# Patient Record
Sex: Female | Born: 1985 | Race: White | Hispanic: No | Marital: Married | Smoking: Current every day smoker
Health system: Southern US, Community
[De-identification: ages and names within clinical notes are randomized; demographics above are authoritative.]

## PROBLEM LIST (undated history)

## (undated) ENCOUNTER — Emergency Department (HOSPITAL_COMMUNITY): Discharge status: Absent without leave | Payer: No Typology Code available for payment source

## (undated) DIAGNOSIS — J45909 Unspecified asthma, uncomplicated: Secondary | ICD-10-CM

## (undated) DIAGNOSIS — M1990 Unspecified osteoarthritis, unspecified site: Secondary | ICD-10-CM

## (undated) DIAGNOSIS — M549 Dorsalgia, unspecified: Secondary | ICD-10-CM

## (undated) DIAGNOSIS — R011 Cardiac murmur, unspecified: Secondary | ICD-10-CM

## (undated) DIAGNOSIS — F32A Depression, unspecified: Secondary | ICD-10-CM

## (undated) DIAGNOSIS — F419 Anxiety disorder, unspecified: Secondary | ICD-10-CM

## (undated) DIAGNOSIS — K219 Gastro-esophageal reflux disease without esophagitis: Secondary | ICD-10-CM

## (undated) DIAGNOSIS — G8929 Other chronic pain: Secondary | ICD-10-CM

## (undated) HISTORY — PX: TUBAL LIGATION: SHX77

---
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
## 2007-07-25 ENCOUNTER — Emergency Department (HOSPITAL_COMMUNITY): Admission: EM | Admit: 2007-07-25 | Discharge: 2007-07-25 | Payer: Self-pay | Admitting: Emergency Medicine

## 2007-08-02 ENCOUNTER — Emergency Department (HOSPITAL_COMMUNITY): Admission: EM | Admit: 2007-08-02 | Discharge: 2007-08-02 | Payer: Self-pay | Admitting: Emergency Medicine

## 2008-01-14 ENCOUNTER — Emergency Department (HOSPITAL_COMMUNITY): Admission: EM | Admit: 2008-01-14 | Discharge: 2008-01-14 | Payer: Self-pay | Admitting: Emergency Medicine

## 2009-04-05 ENCOUNTER — Emergency Department (HOSPITAL_COMMUNITY): Admission: EM | Admit: 2009-04-05 | Discharge: 2009-04-05 | Payer: Self-pay | Admitting: Emergency Medicine

## 2009-05-28 ENCOUNTER — Emergency Department (HOSPITAL_COMMUNITY): Admission: EM | Admit: 2009-05-28 | Discharge: 2009-05-28 | Payer: Self-pay | Admitting: Emergency Medicine

## 2011-06-17 LAB — URINALYSIS, ROUTINE W REFLEX MICROSCOPIC
Glucose, UA: NEGATIVE
Hgb urine dipstick: NEGATIVE
Ketones, ur: NEGATIVE
Protein, ur: NEGATIVE
pH: 6.5

## 2011-06-17 LAB — WET PREP, GENITAL
Trich, Wet Prep: NONE SEEN
Yeast Wet Prep HPF POC: NONE SEEN

## 2011-06-17 LAB — GC/CHLAMYDIA PROBE AMP, GENITAL: GC Probe Amp, Genital: NEGATIVE

## 2011-07-01 LAB — URINE MICROSCOPIC-ADD ON

## 2011-07-01 LAB — PREGNANCY, URINE: Preg Test, Ur: NEGATIVE

## 2011-07-01 LAB — STREP A DNA PROBE

## 2011-07-01 LAB — URINALYSIS, ROUTINE W REFLEX MICROSCOPIC
Nitrite: POSITIVE — AB
Specific Gravity, Urine: <1.005 — ABNORMAL LOW
Urobilinogen, UA: 0.2
pH: 6

## 2011-07-01 LAB — URINE CULTURE

## 2013-09-21 ENCOUNTER — Emergency Department (HOSPITAL_COMMUNITY)
Admission: EM | Admit: 2013-09-21 | Discharge: 2013-09-21 | Disposition: A | Discharge status: Home / Self Care | Payer: Medicaid - Out of State | Attending: Emergency Medicine | Admitting: Emergency Medicine

## 2013-09-21 ENCOUNTER — Encounter (HOSPITAL_COMMUNITY): Payer: Self-pay | Admitting: Emergency Medicine

## 2013-09-21 DIAGNOSIS — F17200 Nicotine dependence, unspecified, uncomplicated: Secondary | ICD-10-CM | POA: Insufficient documentation

## 2013-09-21 DIAGNOSIS — J069 Acute upper respiratory infection, unspecified: Secondary | ICD-10-CM

## 2013-09-21 DIAGNOSIS — IMO0001 Reserved for inherently not codable concepts without codable children: Secondary | ICD-10-CM | POA: Insufficient documentation

## 2013-09-21 NOTE — ED Notes (Signed)
Cough, green sputum.  Chills, no fever.  abd pain

## 2013-09-21 NOTE — ED Provider Notes (Signed)
CSN: [id number]     Arrival date & time 09/21/13  1916 History   First MD Initiated Contact with Patient 09/21/13 1959     Chief Complaint  Patient presents with  . Cough   (Consider location/radiation/quality/duration/timing/severity/associated sxs/prior Treatment) HPI Comments: 27 yo female with vaccines UTD, smoker, no medical hx presents with cough and chills for three days.  Sick contacts with similar.  Mild body aches.  No travel or neck stiffness. Productive. No current abx.  Patient is a 27 y.o. female presenting with cough. The history is provided by the patient.  Cough Associated symptoms: no chills, no fever, no rash and no shortness of breath     History reviewed. No pertinent past medical history. History reviewed. No pertinent past surgical history. History reviewed. No pertinent family history. History  Substance Use Topics  . Smoking status: Current Every Day Smoker  . Smokeless tobacco: Not on file  . Alcohol Use: Yes   OB History   Grav Para Term Preterm Abortions TAB SAB Ect Mult Living                 Review of Systems  Constitutional: Negative for fever and chills.  HENT: Positive for congestion.   Eyes: Negative for visual disturbance.  Respiratory: Positive for cough. Negative for shortness of breath.   Gastrointestinal: Negative for vomiting.  Genitourinary: Negative for dysuria and flank pain.  Musculoskeletal: Negative for back pain, neck pain and neck stiffness.  Skin: Negative for rash.    Allergies  Review of patient's allergies indicates no known allergies.  Home Medications  No current outpatient prescriptions on file. BP 117/71  Pulse 96  Temp(Src) 98 F (36.7 C) (Oral)  Resp 20  Ht 5\' 4"  (1.626 m)  Wt 171 lb (77.565 kg)  BMI 29.34 kg/m2  SpO2 99%  LMP 09/01/2013 Physical Exam  Nursing note and vitals reviewed. Constitutional: She is oriented to person, place, and time. She appears well-developed and well-nourished.  HENT:   Head: Normocephalic and atraumatic.  Eyes: Conjunctivae are normal. Right eye exhibits no discharge. Left eye exhibits no discharge.  Neck: Normal range of motion. Neck supple. No tracheal deviation present.  Cardiovascular: Normal rate and regular rhythm.   Pulmonary/Chest: Effort normal and breath sounds normal.  Abdominal: Soft. She exhibits no distension. There is no tenderness. There is no guarding.  Musculoskeletal: She exhibits no edema.  Neurological: She is alert and oriented to person, place, and time.  Skin: Skin is warm. No rash noted.  Psychiatric: She has a normal mood and affect.    ED Course  Procedures (including critical care time) Labs Review Labs Reviewed - No data to display Imaging Review No results found.  EKG Interpretation   None       MDM   1. URI, acute    Well appearing. No meningismus. Results and differential diagnosis were discussed with the patient. Close follow up outpatient was discussed, patient comfortable with the plan.   Diagnosis: URI    Enid Skeens, MD 09/21/13 2030

## 2013-09-21 NOTE — ED Notes (Signed)
MD at bedside. 

## 2016-09-10 ENCOUNTER — Emergency Department (HOSPITAL_COMMUNITY)
Admission: EM | Admit: 2016-09-10 | Discharge: 2016-09-10 | Disposition: A | Discharge status: Home / Self Care | Payer: Medicaid - Out of State | Attending: Emergency Medicine | Admitting: Emergency Medicine

## 2016-09-10 ENCOUNTER — Encounter (HOSPITAL_COMMUNITY): Payer: Self-pay | Admitting: Emergency Medicine

## 2016-09-10 DIAGNOSIS — F17210 Nicotine dependence, cigarettes, uncomplicated: Secondary | ICD-10-CM | POA: Insufficient documentation

## 2016-09-10 DIAGNOSIS — J069 Acute upper respiratory infection, unspecified: Secondary | ICD-10-CM

## 2016-09-10 DIAGNOSIS — R110 Nausea: Secondary | ICD-10-CM | POA: Insufficient documentation

## 2016-09-10 DIAGNOSIS — Z79899 Other long term (current) drug therapy: Secondary | ICD-10-CM | POA: Insufficient documentation

## 2016-09-10 MED ORDER — PREDNISONE 20 MG PO TABS: 40.0000 mg | ORAL_TABLET | Freq: Every day | ORAL | 0 refills | Status: DC

## 2016-09-10 MED ORDER — PREDNISONE 20 MG PO TABS
40.0000 mg | ORAL_TABLET | Freq: Once | ORAL | Status: AC
  Administered 2016-09-10: 40 mg via ORAL
  Filled 2016-09-10: qty 2

## 2016-09-10 MED ORDER — GUAIFENESIN-CODEINE 100-10 MG/5ML PO SYRP: 10.0000 mL | ORAL_SOLUTION | Freq: Three times a day (TID) | ORAL | 0 refills | Status: DC | PRN

## 2016-09-10 MED ORDER — BENZONATATE 100 MG PO CAPS
200.0000 mg | ORAL_CAPSULE | Freq: Once | ORAL | Status: AC
  Administered 2016-09-10: 200 mg via ORAL
  Filled 2016-09-10: qty 2

## 2016-09-10 NOTE — ED Triage Notes (Signed)
Pt c/o nausea, body aches, cough, sore throat and otalgia that started 4 days ago.

## 2016-09-10 NOTE — Discharge Instructions (Signed)
Drink plenty of fluids.  Tylenol every 4 hrs as needed for fever or body aches.  Stop the OTC cold medications.  Follow-up with your doctor or return here if needed.

## 2016-09-14 NOTE — ED Provider Notes (Signed)
[facility]EMERG[facility]Y DEPT Provider Note   CSN: [id number] Arrival date & time: 09/10/16  2046     History   Chief Complaint Chief Complaint  Patient presents with  . Otalgia  . Sore Throat    HPI Tad MooreJessica D Castaneda is a 30 y.o. female.  HPI  Tad MooreJessica D Castaneda is a 30 y.o. female who presents to the Emergency Department complaining of cough, sore throat, nasal congestion  And body aches.  Symptoms began 4 days ago.   She has not taken any medications for symptom relief.  Cough has been nonproductive.  Also reports some nausea.  She denies fever, chills, vomiting or abd pain.  Also denies chest pain or shortness of breath.    History reviewed. No pertinent past medical history.  There are no active problems to display for this patient.   Past Surgical History:  Procedure Laterality Date  . TUBAL LIGATION      OB History    Gravida Para Term Preterm AB Living   3 3 3     3    SAB TAB Ectopic Multiple Live Births                   Home Medications    Prior to Admission medications   Medication Sig Start Date End Date Taking? Authorizing Provider  guaiFENesin-codeine (ROBITUSSIN AC) 100-10 MG/5ML syrup Take 10 mLs by mouth 3 (three) times daily as needed. 09/10/16   Fardeen Steinberger, PA-C  predniSONE (DELTASONE) 20 MG tablet Take 2 tablets (40 mg total) by mouth daily. For 5 days 09/10/16   Pauline Ausammy Jeany [place], PA-C  Prenatal Vit-Fe Fumarate-FA (MULTIVITAMIN-PRENATAL) 27-0.8 MG TABS tablet Take 1 tablet by mouth daily at 12 noon.    Historical Provider, MD    Family History History reviewed. No pertinent family history.  Social History Social History  Substance Use Topics  . Smoking status: Current Every Day Smoker    Packs/day: 0.50    Types: Cigarettes  . Smokeless tobacco: Never Used  . Alcohol use Yes     Comment: socially     Allergies   Patient has no known allergies.   Review of Systems Review of Systems  Constitutional: Negative for activity change,  appetite change, chills and fever.  HENT: Positive for congestion, ear pain, rhinorrhea and sore throat. Negative for facial swelling and trouble swallowing.   Eyes: Negative for visual disturbance.  Respiratory: Positive for cough. Negative for shortness of breath, wheezing and stridor.   Cardiovascular: Negative for chest pain.  Gastrointestinal: Positive for nausea. Negative for abdominal pain and vomiting.  Genitourinary: Negative for dysuria.  Musculoskeletal: Negative for neck pain and neck stiffness.  Skin: Negative.   Neurological: Negative for dizziness, weakness, numbness and headaches.  Hematological: Negative for adenopathy.  Psychiatric/Behavioral: Negative for confusion.  All other systems reviewed and are negative.    Physical Exam Updated Vital Signs BP 96/61 (BP Location: Right Arm)   Pulse 72   Temp 97.7 F (36.5 C) (Oral)   Resp 18   Ht 5\' 4"  (1.626 m)   Wt 90.7 kg   LMP 08/16/2016   SpO2 97%   BMI 34.33 kg/m   Physical Exam  Constitutional: She is oriented to person, place, and time. She appears well-developed and well-nourished. No distress.  HENT:  Head: Normocephalic and atraumatic.  Right Ear: Tympanic membrane and ear canal normal.  Left Ear: Tympanic membrane and ear canal normal.  Nose: Mucosal edema and rhinorrhea present.  Mouth/Throat: Uvula is  midline and mucous membranes are normal. No trismus in the jaw. No uvula swelling. Posterior oropharyngeal erythema present. No oropharyngeal exudate, posterior oropharyngeal edema or tonsillar abscesses.  Eyes: Conjunctivae are normal.  Neck: Normal range of motion and phonation normal. Neck supple. No Brudzinski's sign and no Kernig's sign noted.  Cardiovascular: Normal rate, regular rhythm and intact distal pulses.   No murmur heard. Pulmonary/Chest: Effort normal and breath sounds normal. No respiratory distress. She has no wheezes. She has no rales.  Abdominal: Soft. She exhibits no distension.  There is no tenderness. There is no rebound and no guarding.  Musculoskeletal: She exhibits no edema.  Lymphadenopathy:    She has no cervical adenopathy.  Neurological: She is alert and oriented to person, place, and time. She exhibits normal muscle tone. Coordination normal.  Skin: Skin is warm and dry.  Nursing note and vitals reviewed.    ED Treatments / Results  Labs (all labs ordered are listed, but only abnormal results are displayed) Labs Reviewed - No data to display  EKG  EKG Interpretation None       Radiology No results found.  Procedures Procedures (including critical care time)  Medications Ordered in ED Medications  predniSONE (DELTASONE) tablet 40 mg (40 mg Oral Given 09/10/16 2222)  benzonatate (TESSALON) capsule 200 mg (200 mg Oral Given 09/10/16 2222)     Initial Impression / Assessment and Plan / ED Course  I have reviewed the triage vital signs and the nursing notes.  Pertinent labs & imaging results that were available during my care of the patient were reviewed by me and considered in my medical decision making (see chart for details).  Clinical Course     Pt non-toxic appearing.  Vitals stable.  Likely viral process.  Pt agrees to symptomatic tx and PMD f/u if needed.  Pt stable for d/c  Final Clinical Impressions(s) / ED Diagnoses   Final diagnoses:  Upper respiratory tract infection, unspecified type    New Prescriptions Discharge Medication List as of 09/10/2016 10:18 PM    START taking these medications   Details  guaiFENesin-codeine (ROBITUSSIN AC) 100-10 MG/5ML syrup Take 10 mLs by mouth 3 (three) times daily as needed., Starting Wed 09/10/2016, Print    predniSONE (DELTASONE) 20 MG tablet Take 2 tablets (40 mg total) by mouth daily. For 5 days, Starting Wed 09/10/2016, Print         Lauren Castaneda, [place]PA-C 09/14/16 1558    Eber HongBrian Miller, MD 09/16/16 [phone]

## 2016-12-18 ENCOUNTER — Emergency Department (HOSPITAL_COMMUNITY): Payer: No Typology Code available for payment source

## 2016-12-18 ENCOUNTER — Emergency Department (HOSPITAL_COMMUNITY)
Admission: EM | Admit: 2016-12-18 | Discharge: 2016-12-19 | Disposition: A | Discharge status: Home / Self Care | Payer: No Typology Code available for payment source | Attending: Emergency Medicine | Admitting: Emergency Medicine

## 2016-12-18 ENCOUNTER — Encounter (HOSPITAL_COMMUNITY): Payer: Self-pay

## 2016-12-18 DIAGNOSIS — Y92410 Unspecified street and highway as the place of occurrence of the external cause: Secondary | ICD-10-CM | POA: Diagnosis not present

## 2016-12-18 DIAGNOSIS — S5011XA Contusion of right forearm, initial encounter: Secondary | ICD-10-CM | POA: Insufficient documentation

## 2016-12-18 DIAGNOSIS — S40022A Contusion of left upper arm, initial encounter: Secondary | ICD-10-CM | POA: Diagnosis not present

## 2016-12-18 DIAGNOSIS — Y999 Unspecified external cause status: Secondary | ICD-10-CM | POA: Diagnosis not present

## 2016-12-18 DIAGNOSIS — T07XXXA Unspecified multiple injuries, initial encounter: Secondary | ICD-10-CM

## 2016-12-18 DIAGNOSIS — Y9389 Activity, other specified: Secondary | ICD-10-CM | POA: Diagnosis not present

## 2016-12-18 DIAGNOSIS — F17210 Nicotine dependence, cigarettes, uncomplicated: Secondary | ICD-10-CM | POA: Diagnosis not present

## 2016-12-18 DIAGNOSIS — S59911A Unspecified injury of right forearm, initial encounter: Secondary | ICD-10-CM | POA: Diagnosis present

## 2016-12-18 NOTE — ED Notes (Signed)
Pt to xray
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
## 2016-12-19 MED ORDER — IBUPROFEN 800 MG PO TABS: 800.0000 mg | ORAL_TABLET | Freq: Three times a day (TID) | ORAL | 0 refills | Status: DC

## 2016-12-19 MED ORDER — CYCLOBENZAPRINE HCL 5 MG PO TABS: 5.0000 mg | ORAL_TABLET | Freq: Two times a day (BID) | ORAL | 0 refills | Status: DC | PRN

## 2016-12-19 NOTE — Discharge Instructions (Signed)
Apply ice packs on/off to help with swelling and bruising.  You can wear the sling as needed.  Follow-up with your doctor or return here for any worsening symptoms

## 2017-09-18 ENCOUNTER — Emergency Department (HOSPITAL_COMMUNITY)
Admission: EM | Admit: 2017-09-18 | Discharge: 2017-09-18 | Disposition: A | Discharge status: Home / Self Care | Payer: BLUE CROSS/BLUE SHIELD | Attending: Emergency Medicine | Admitting: Emergency Medicine

## 2017-09-18 ENCOUNTER — Other Ambulatory Visit: Payer: Self-pay

## 2017-09-18 ENCOUNTER — Encounter (HOSPITAL_COMMUNITY): Payer: Self-pay | Admitting: *Deleted

## 2017-09-18 DIAGNOSIS — R197 Diarrhea, unspecified: Secondary | ICD-10-CM | POA: Insufficient documentation

## 2017-09-18 DIAGNOSIS — J069 Acute upper respiratory infection, unspecified: Secondary | ICD-10-CM | POA: Diagnosis not present

## 2017-09-18 DIAGNOSIS — B9789 Other viral agents as the cause of diseases classified elsewhere: Secondary | ICD-10-CM | POA: Insufficient documentation

## 2017-09-18 DIAGNOSIS — R0981 Nasal congestion: Secondary | ICD-10-CM | POA: Insufficient documentation

## 2017-09-18 DIAGNOSIS — R5383 Other fatigue: Secondary | ICD-10-CM | POA: Insufficient documentation

## 2017-09-18 DIAGNOSIS — F17210 Nicotine dependence, cigarettes, uncomplicated: Secondary | ICD-10-CM | POA: Diagnosis not present

## 2017-09-18 DIAGNOSIS — R140 Abdominal distension (gaseous): Secondary | ICD-10-CM | POA: Insufficient documentation

## 2017-09-18 DIAGNOSIS — N926 Irregular menstruation, unspecified: Secondary | ICD-10-CM | POA: Insufficient documentation

## 2017-09-18 DIAGNOSIS — N644 Mastodynia: Secondary | ICD-10-CM | POA: Insufficient documentation

## 2017-09-18 DIAGNOSIS — R6883 Chills (without fever): Secondary | ICD-10-CM | POA: Insufficient documentation

## 2017-09-18 DIAGNOSIS — M7981 Nontraumatic hematoma of soft tissue: Secondary | ICD-10-CM | POA: Diagnosis not present

## 2017-09-18 DIAGNOSIS — R05 Cough: Secondary | ICD-10-CM | POA: Diagnosis present

## 2017-09-18 DIAGNOSIS — Z79899 Other long term (current) drug therapy: Secondary | ICD-10-CM | POA: Diagnosis not present

## 2017-09-18 LAB — BASIC METABOLIC PANEL
ANION GAP: 9 (ref 5–15)
BUN: 8 mg/dL (ref 6–20)
CALCIUM: 8.7 mg/dL — AB (ref 8.9–10.3)
CO2: 22 mmol/L (ref 22–32)
Chloride: 105 mmol/L (ref 101–111)
Creatinine, Ser: 0.62 mg/dL (ref 0.44–1.00)
GLUCOSE: 105 mg/dL — AB (ref 65–99)
Potassium: 4.2 mmol/L (ref 3.5–5.1)
Sodium: 136 mmol/L (ref 135–145)

## 2017-09-18 LAB — URINALYSIS, ROUTINE W REFLEX MICROSCOPIC
Bilirubin Urine: NEGATIVE
Glucose, UA: NEGATIVE mg/dL
Hgb urine dipstick: NEGATIVE
KETONES UR: NEGATIVE mg/dL
LEUKOCYTES UA: NEGATIVE
NITRITE: NEGATIVE
PROTEIN: NEGATIVE mg/dL
Specific Gravity, Urine: 1.023 (ref 1.005–1.030)
pH: 5 (ref 5.0–8.0)

## 2017-09-18 LAB — CBC WITH DIFFERENTIAL/PLATELET
BASOS ABS: 0 10*3/uL (ref 0.0–0.1)
BASOS PCT: 0 %
EOS ABS: 0.4 10*3/uL (ref 0.0–0.7)
Eosinophils Relative: 4 %
HEMATOCRIT: 44.1 % (ref 36.0–46.0)
Hemoglobin: 14.8 g/dL (ref 12.0–15.0)
Lymphocytes Relative: 22 %
Lymphs Abs: 2.1 10*3/uL (ref 0.7–4.0)
MCH: 33 pg (ref 26.0–34.0)
MCHC: 33.6 g/dL (ref 30.0–36.0)
MCV: 98.2 fL (ref 78.0–100.0)
MONO ABS: 0.6 10*3/uL (ref 0.1–1.0)
Monocytes Relative: 7 %
NEUTROS ABS: 6.4 10*3/uL (ref 1.7–7.7)
NEUTROS PCT: 67 %
Platelets: 133 10*3/uL — ABNORMAL LOW (ref 150–400)
RBC: 4.49 MIL/uL (ref 3.87–5.11)
RDW: 12.4 % (ref 11.5–15.5)
WBC: 9.5 10*3/uL (ref 4.0–10.5)

## 2017-09-18 LAB — POC URINE PREG, ED: PREG TEST UR: NEGATIVE

## 2017-09-18 NOTE — ED Notes (Signed)
Urine specimen obtained and sent to lab

## 2017-09-18 NOTE — ED Triage Notes (Signed)
Pt c/o cough x2 days.  °

## 2017-09-18 NOTE — ED Provider Notes (Signed)
[facility] EMERGENCY DEPARTMENT Provider Note   CSN: [id number] Arrival date & time: 09/18/17  [id number]     History   Chief Complaint Chief Complaint  Patient presents with  . Cough    HPI Lauren Castaneda is a 31 y.o. female.  The history is provided by the patient.  Cough  This is a new problem. The current episode started more than 2 days ago. The problem has been gradually worsening. The cough is non-productive. Associated symptoms include chills.  Patient Presents for multiple complaints  She reports "I have something wrong with me and I need to find out tonight" Reports recent cough and congestion, no fevers No significant chest pain or shortness of breath But she reports bloating, diarrhea, breast tenderness, irregular periods Also reports bruising, and fatigue    PMH -none Soc hx  -smoker Past Surgical History:  Procedure Laterality Date  . TUBAL LIGATION      OB History    Gravida Para Term Preterm AB Living   3 3 3     3    SAB TAB Ectopic Multiple Live Births                   Home Medications    Prior to Admission medications   Medication Sig Start Date End Date Taking? Authorizing Provider  Prenatal Vit-Fe Fumarate-FA (MULTIVITAMIN-PRENATAL) 27-0.8 MG TABS tablet Take 1 tablet by mouth daily at 12 noon.    [provider]    Family History History reviewed. No pertinent family history.  Social History Social History   Tobacco Use  . Smoking status: Current Every Day Smoker    Packs/day: 0.50    Types: Cigarettes  . Smokeless tobacco: Never Used  Substance Use Topics  . Alcohol use: Yes    Comment: socially  . Drug use: No     Allergies   Patient has no known allergies.   Review of Systems Review of Systems  Constitutional: Positive for chills and fatigue.  Respiratory: Positive for cough.   Gastrointestinal: Positive for diarrhea.  Genitourinary: Positive for dysuria.  Hematological: Bruises/bleeds easily.  All  other systems reviewed and are negative.    Physical Exam Updated Vital Signs BP 131/78 (BP Location: Left Arm)   Pulse 65   Temp 98.3 F (36.8 C) (Oral)   Resp 18   Ht 1.626 m (5\' 4" )   Wt 83 kg (183 lb)   LMP 08/27/2017   SpO2 98%   BMI 31.41 kg/m   Physical Exam CONSTITUTIONAL: Well developed/well nourished HEAD: Normocephalic/atraumatic EYES: EOMI/PERRL ENMT: Mucous membranes moist bilateral TMs clear and intact, uvula midline without erythema or exudate NECK: supple no meningeal signs SPINE/BACK:entire spine nontender CV: S1/S2 noted, no murmurs/rubs/gallops noted LUNGS: Lungs are clear to auscultation bilaterally, no apparent distress ABDOMEN: soft, nontender, no rebound or guarding, bowel sounds noted throughout abdomen GU:no cva tenderness NEURO: Pt is awake/alert/appropriate, moves all extremitiesx4.  No facial droop.   EXTREMITIES: pulses normal/equal, full ROM, no lower extremity edema or tenderness SKIN: warm, color normal, scattered small bruising to extremities PSYCH: no abnormalities of mood noted, alert and oriented to situation   ED Treatments / Results  Labs (all labs ordered are listed, but only abnormal results are displayed) Labs Reviewed  BASIC METABOLIC PANEL - Abnormal; Notable for the following components:      Result Value   Glucose, Bld 105 (*)    Calcium 8.7 (*)    All other components within normal limits  CBC WITH DIFFERENTIAL/PLATELET - Abnormal; Notable for the following components:   Platelets 133 (*)    All other components within normal limits  URINALYSIS, ROUTINE W REFLEX MICROSCOPIC  POC URINE PREG, ED    EKG  EKG Interpretation None       Radiology No results found.  Procedures Procedures (including critical care time)  Medications Ordered in ED Medications - No data to display   Initial Impression / Assessment and Plan / ED Course  I have reviewed the triage vital signs and the nursing notes.  Pertinent labs   results that were available during my care of the patient were reviewed by me and considered in my medical decision making (see chart for details).     Presents with multiple complaints, and requesting laboratory evaluation for "not feeling right " In terms of her cough, this appears to be a viral process, lungs are clear and no distress noted no hypoxia Advised we can check a BMP, CBC, urinalysis and pregnancy test. Otherwise will need to follow-up with her primary physician for her other complaints  6:35 AM Advised of lab results, and have referred her to a primary doctor Final Clinical Impressions(s) / ED Diagnoses   Final diagnoses:  Viral URI with cough    ED Discharge Orders    None       Zadie RhineWickline, Veda Arrellano, MD 09/18/17 [phone]

## 2017-09-18 NOTE — ED Notes (Signed)
Pt given a warm blanket upon request

## 2018-05-17 ENCOUNTER — Other Ambulatory Visit: Payer: Self-pay

## 2018-05-17 ENCOUNTER — Encounter (HOSPITAL_COMMUNITY): Payer: Self-pay | Admitting: Emergency Medicine

## 2018-05-17 ENCOUNTER — Emergency Department (HOSPITAL_COMMUNITY): Payer: Self-pay

## 2018-05-17 ENCOUNTER — Emergency Department (HOSPITAL_COMMUNITY)
Admission: EM | Admit: 2018-05-17 | Discharge: 2018-05-17 | Disposition: A | Discharge status: Home / Self Care | Payer: Medicaid - Out of State | Attending: Emergency Medicine | Admitting: Emergency Medicine

## 2018-05-17 DIAGNOSIS — F17210 Nicotine dependence, cigarettes, uncomplicated: Secondary | ICD-10-CM | POA: Insufficient documentation

## 2018-05-17 DIAGNOSIS — M545 Low back pain, unspecified: Secondary | ICD-10-CM

## 2018-05-17 DIAGNOSIS — Z79899 Other long term (current) drug therapy: Secondary | ICD-10-CM | POA: Insufficient documentation

## 2018-05-17 HISTORY — DX: Dorsalgia, unspecified: M54.9

## 2018-05-17 HISTORY — DX: Other chronic pain: G89.29

## 2018-05-17 LAB — POC URINE PREG, ED: PREG TEST UR: NEGATIVE

## 2018-05-17 MED ORDER — METHOCARBAMOL 500 MG PO TABS: 500.0000 mg | ORAL_TABLET | Freq: Two times a day (BID) | ORAL | 0 refills | Status: DC

## 2018-05-17 MED ORDER — METHYLPREDNISOLONE SODIUM SUCC 125 MG IJ SOLR
125.0000 mg | Freq: Once | INTRAMUSCULAR | Status: AC
  Administered 2018-05-17: 125 mg via INTRAMUSCULAR
  Filled 2018-05-17: qty 2

## 2018-05-17 MED ORDER — DICLOFENAC SODIUM 50 MG PO TBEC: 50.0000 mg | DELAYED_RELEASE_TABLET | Freq: Two times a day (BID) | ORAL | 0 refills | Status: DC

## 2018-05-17 MED ORDER — KETOROLAC TROMETHAMINE 60 MG/2ML IM SOLN
60.0000 mg | Freq: Once | INTRAMUSCULAR | Status: AC
  Administered 2018-05-17: 60 mg via INTRAMUSCULAR
  Filled 2018-05-17: qty 2

## 2018-05-17 NOTE — ED Provider Notes (Signed)
[facility] Provider Note   CSN: [id number] Arrival date & time: 05/17/18  1155     History   Chief Complaint Chief Complaint  Patient presents with  . Back Pain    HPI Lauren Castaneda is a 32 y.o. female.  The history is provided by the patient. No language interpreter was used.  Back Pain   This is a new problem. The current episode started more than 1 week ago. The problem occurs constantly. The problem has been gradually worsening. The pain is associated with no known injury. The pain is present in the lumbar spine. The quality of the pain is described as aching. The pain is severe. The symptoms are aggravated by bending. The pain is worse during the day. Pertinent negatives include no chest pain and no abdominal pain. She has tried NSAIDs for the symptoms. The treatment provided no relief.    Past Medical History:  Diagnosis Date  . Chronic back pain     There are no active problems to display for this patient.   Past Surgical History:  Procedure Laterality Date  . TUBAL LIGATION       OB History    Gravida  3   Para  3   Term  3   Preterm      AB      Living  3     SAB      TAB      Ectopic      Multiple      Live Births               Home Medications    Prior to Admission medications   Medication Sig Start Date End Date Taking? Authorizing Provider  Prenatal Vit-Fe Fumarate-FA (MULTIVITAMIN-PRENATAL) 27-0.8 MG TABS tablet Take 1 tablet by mouth daily at 12 noon.    [provider]    Family History No family history on file.  Social History Social History   Tobacco Use  . Smoking status: Current Every Day Smoker    Packs/day: 0.50    Types: Cigarettes  . Smokeless tobacco: Never Used  Substance Use Topics  . Alcohol use: Yes    Comment: socially  . Drug use: No     Allergies   Phenergan [promethazine hcl]   Review of Systems Review of Systems  Cardiovascular: Negative for chest  pain.  Gastrointestinal: Negative for abdominal pain.  Musculoskeletal: Positive for back pain.  All other systems reviewed and are negative.    Physical Exam Updated Vital Signs BP 113/73 (BP Location: Right Arm)   Pulse 60   Temp 98.1 F (36.7 C) (Oral)   Resp 18   Ht 5\' 4"  (1.626 m)   Wt 86.2 kg   LMP 05/15/2018   SpO2 98%   BMI 32.61 kg/m   Physical Exam  Constitutional: She is oriented to person, place, and time. She appears well-developed and well-nourished.  HENT:  Head: Normocephalic.  Eyes: EOM are normal.  Neck: Normal range of motion.  Pulmonary/Chest: Effort normal.  Abdominal: She exhibits no distension.  Musculoskeletal: Normal range of motion.  Tender low back, pain with movement  Neurological: She is alert and oriented to person, place, and time.  Skin: Skin is warm.  Psychiatric: She has a normal mood and affect.  Nursing note and vitals reviewed.    ED Treatments / Results  Labs (all labs ordered are listed, but only abnormal results are displayed) Labs Reviewed - No  data to display  EKG None  Radiology Dg Lumbar Spine Complete  Result Date: 05/17/2018 CLINICAL DATA:  Pain in lower back.  No apparent injury. EXAM: LUMBAR SPINE - COMPLETE 4+ VIEW COMPARISON:  None. FINDINGS: There is no evidence of lumbar spine fracture. Alignment is normal. L4-5 and L5-S1 intervertebral disc spaces are narrowed. There is lower lumbar facet arthropathy. No definite pars defects. IMPRESSION: No acute findings.  Disc space narrowing L4-5 and L5-S1. Electronically Signed   By: Elsie Stain M.D.   On: 05/17/2018 14:14    Procedures Procedures (including critical care time)  Medications Ordered in ED Medications - No data to display   Initial Impression / Assessment and Plan / ED Course  I have reviewed the triage vital signs and the nursing notes.  Pertinent labs & imaging results that were available during my care of the patient were reviewed by me and  considered in my medical decision making (see chart for details).     MDM  Pt counseled on results.  Pt given injection of solumedrol and torodol.  Pt advised to follow up with Dr. Romeo Apple for evaltuion if pain persist  Final Clinical Impressions(s) / ED Diagnoses   Final diagnoses:  Acute low back pain without sciatica, unspecified back pain laterality    ED Discharge Orders         Ordered    diclofenac (VOLTAREN) 50 MG EC tablet  2 times daily     05/17/18 1444    methocarbamol (ROBAXIN) 500 MG tablet  2 times daily     05/17/18 1444        An After Visit Summary was printed and given to the patient.    Elson Areas, [place] 05/17/18 1606    Loren Racer, MD 05/18/18 [phone]

## 2018-05-17 NOTE — ED Triage Notes (Signed)
Hx of chronic back pain.  States it has gotten worse the past week.

## 2018-05-25 ENCOUNTER — Ambulatory Visit: Payer: Self-pay | Admitting: Orthopaedic Surgery

## 2018-05-25 ENCOUNTER — Encounter: Payer: Self-pay | Admitting: Orthopaedic Surgery

## 2018-11-04 ENCOUNTER — Encounter: Payer: Self-pay | Admitting: Orthopaedic Surgery

## 2018-11-04 ENCOUNTER — Ambulatory Visit: Payer: BLUE CROSS/BLUE SHIELD | Admitting: Orthopaedic Surgery

## 2018-11-04 DIAGNOSIS — M5136 Other intervertebral disc degeneration, lumbar region: Secondary | ICD-10-CM | POA: Diagnosis not present

## 2018-11-04 NOTE — Progress Notes (Signed)
Subjective:    Patient ID: Lauren MooreJessica D Castaneda, female    DOB: 11/13/1985, 33 y.o.   MRN: [id number]  HPI She has had pain in the lower back for years getting worse and worse.  She has no history of trauma.  She has been seen in the ER 05-17-18 and had X-rays of the lumbar spine which were negative.  I have reviewed the ER record and the x-rays.  She has constant pain.  She cannot bend forward very much.  She uses a heating pad, tried BenGay, rubs, Tylenol.  She cannot take NSAIDs secondary to GI problems.  She has no weakness, no numbness.  She is tired of hurting.     Review of Systems  Constitutional: Positive for activity change.  HENT: Positive for hearing loss.   Musculoskeletal: Positive for arthralgias and back pain.  All other systems reviewed and are negative.  For Review of Systems, all other systems reviewed and are negative.  The following is a summary of the past history medically, past history surgically, known current medicines, social history and family history.  This information is gathered electronically by the computer from prior information and documentation.  I review this each visit and have found including this information at this point in the chart is beneficial and informative.   Past Medical History:  Diagnosis Date  . Chronic back pain     Past Surgical History:  Procedure Laterality Date  . TUBAL LIGATION      Current Outpatient Medications on File Prior to Visit  Medication Sig Dispense Refill  . diclofenac (VOLTAREN) 50 MG EC tablet Take 1 tablet (50 mg total) by mouth 2 (two) times daily. 20 tablet 0  . methocarbamol (ROBAXIN) 500 MG tablet Take 1 tablet (500 mg total) by mouth 2 (two) times daily. 20 tablet 0  . Prenatal Vit-Fe Fumarate-FA (MULTIVITAMIN-PRENATAL) 27-0.8 MG TABS tablet Take 1 tablet by mouth daily at 12 noon.     No current facility-administered medications on file prior to visit.     Social History   Socioeconomic History  .  Marital status: Married    Spouse name: Not on file  . Number of children: Not on file  . Years of education: Not on file  . Highest education level: Not on file  Occupational History  . Not on file  Social Needs  . Financial resource strain: Not on file  . Food insecurity:    Worry: Not on file    Inability: Not on file  . Transportation needs:    Medical: Not on file    Non-medical: Not on file  Tobacco Use  . Smoking status: Current Every Day Smoker    Packs/day: 0.50    Types: Cigarettes  . Smokeless tobacco: Never Used  Substance and Sexual Activity  . Alcohol use: Yes    Comment: socially  . Drug use: No  . Sexual activity: Yes    Birth control/protection: None  Lifestyle  . Physical activity:    Days per week: Not on file    Minutes per session: Not on file  . Stress: Not on file  Relationships  . Social connections:    Talks on phone: Not on file    Gets together: Not on file    Attends religious service: Not on file    Active member of club or organization: Not on file    Attends meetings of clubs or organizations: Not on file    Relationship status: Not  on file  . Intimate partner violence:    Fear of current or ex partner: Not on file    Emotionally abused: Not on file    Physically abused: Not on file    Forced sexual activity: Not on file  Other Topics Concern  . Not on file  Social History Narrative  . Not on file    History reviewed. No pertinent family history.  BP 108/82   Pulse 74   Ht 5\' 4"  (1.626 m)   Wt 198 lb (89.8 kg)   BMI 33.99 kg/m   Body mass index is 33.99 kg/m.     Objective:   Physical Exam Constitutional:      Appearance: She is well-developed.  HENT:     Head: Normocephalic and atraumatic.  Eyes:     Conjunctiva/sclera: Conjunctivae normal.     Pupils: Pupils are equal, round, and reactive to light.  Neck:     Musculoskeletal: Normal range of motion and neck supple.  Cardiovascular:     Rate and Rhythm: Normal  rate and regular rhythm.  Pulmonary:     Effort: Pulmonary effort is normal.  Abdominal:     Palpations: Abdomen is soft.  Musculoskeletal:     Lumbar back: She exhibits decreased range of motion and tenderness.       Back:  Skin:    General: Skin is warm and dry.  Neurological:     Mental Status: She is alert and oriented to person, place, and time.     Cranial Nerves: No cranial nerve deficit.     Motor: No abnormal muscle tone.     Coordination: Coordination normal.     Deep Tendon Reflexes: Reflexes are normal and symmetric. Reflexes normal.  Psychiatric:        Behavior: Behavior normal.        Thought Content: Thought content normal.        Judgment: Judgment normal.           Assessment & Plan:   Encounter Diagnosis  Name Primary?  . Other intervertebral disc degeneration, lumbar region    I will get a MRI as she has not improved with conservative treatment.  Return after the MRI.  Call if any problem.  Precautions discussed.   Electronically Signed Darreld Mclean, MD 2/13/20208:37 AM

## 2018-11-15 ENCOUNTER — Ambulatory Visit (HOSPITAL_COMMUNITY)
Admission: RE | Admit: 2018-11-15 | Discharge: 2018-11-15 | Disposition: A | Discharge status: Home / Self Care | Payer: BLUE CROSS/BLUE SHIELD | Source: Ambulatory Visit | Attending: Orthopaedic Surgery | Admitting: Orthopaedic Surgery

## 2018-11-15 DIAGNOSIS — M5136 Other intervertebral disc degeneration, lumbar region: Secondary | ICD-10-CM | POA: Diagnosis not present

## 2018-11-17 ENCOUNTER — Ambulatory Visit: Payer: BLUE CROSS/BLUE SHIELD | Admitting: Orthopaedic Surgery

## 2018-11-17 ENCOUNTER — Encounter: Payer: Self-pay | Admitting: Orthopaedic Surgery

## 2018-11-17 VITALS — BP 103/68 | HR 64 | Ht 64.0 in | Wt 200.0 lb

## 2018-11-17 DIAGNOSIS — M5136 Other intervertebral disc degeneration, lumbar region: Secondary | ICD-10-CM | POA: Diagnosis not present

## 2018-11-17 DIAGNOSIS — M25531 Pain in right wrist: Secondary | ICD-10-CM

## 2018-11-17 NOTE — Progress Notes (Signed)
Patient Lauren Castaneda, female DOB:01-17-1986, 33 y.o. OAC:[id number]  Chief Complaint  Patient presents with  . Back Pain  . Wrist Pain    HPI  Lauren Castaneda is a 33 y.o. female who has lower back pain.  She had a MRI which showed: IMPRESSION: L4-5: Disc degeneration and bulging more prominent towards the right. Mild facet hypertrophy. Mild narrowing of the lateral recesses but without visible neural compression. Discogenic endplate changes at the inferior endplate of L4 with mild edema, which could be associated with back pain.  L5-S1: Disc degeneration with disc bulge. Mild facet degeneration. No stenosis. Findings could relate to back pain.  L3-4: Minimal facet hypertrophy without stenosis or edema.  I have explained the findings to her.  I have recommended epidural injections.  This will be scheduled.  She has also developed right dominant wrist pain dorsally for over a week or so.  She has no trauma, no redness, no numbness.  It hurts to dorsiflex the wrist and also some in volar flexion.  She has no swelling.  She has not really done anything for it.   Body mass index is 34.33 kg/m.  ROS  Review of Systems  Constitutional: Positive for activity change.  HENT: Positive for hearing loss.   Musculoskeletal: Positive for arthralgias and back pain.  All other systems reviewed and are negative.   All other systems reviewed and are negative.  The following is a summary of the past history medically, past history surgically, known current medicines, social history and family history.  This information is gathered electronically by the computer from prior information and documentation.  I review this each visit and have found including this information at this point in the chart is beneficial and informative.    Past Medical History:  Diagnosis Date  . Chronic back pain     Past Surgical History:  Procedure Laterality Date  . TUBAL LIGATION      No family  history on file.  Social History Social History   Tobacco Use  . Smoking status: Current Every Day Smoker    Packs/day: 0.50    Types: Cigarettes  . Smokeless tobacco: Never Used  Substance Use Topics  . Alcohol use: Yes    Comment: socially  . Drug use: No    Allergies  Allergen Reactions  . Phenergan [Promethazine Hcl]     Drops bp drastically     Current Outpatient Medications  Medication Sig Dispense Refill  . diclofenac (VOLTAREN) 50 MG EC tablet Take 1 tablet (50 mg total) by mouth 2 (two) times daily. 20 tablet 0  . methocarbamol (ROBAXIN) 500 MG tablet Take 1 tablet (500 mg total) by mouth 2 (two) times daily. 20 tablet 0  . Prenatal Vit-Fe Fumarate-FA (MULTIVITAMIN-PRENATAL) 27-0.8 MG TABS tablet Take 1 tablet by mouth daily at 12 noon.     No current facility-administered medications for this visit.      Physical Exam  Blood pressure 103/68, pulse 64, height 5\' 4"  (1.626 m), weight 200 lb (90.7 kg).  Constitutional: overall normal hygiene, normal nutrition, well developed, normal grooming, normal body habitus. Assistive device:none  Musculoskeletal: gait and station Limp none, muscle tone and strength are normal, no tremors or atrophy is present.  .  Neurological: coordination overall normal.  Deep tendon reflex/nerve stretch intact.  Sensation normal.  Cranial nerves II-XII intact.   Skin:   Normal overall no scars, lesions, ulcers or rashes. No psoriasis.  Psychiatric: Alert and oriented x 3.  Recent memory intact, remote memory unclear.  Normal mood and affect. Well groomed.  Good eye contact.  Cardiovascular: overall no swelling, no varicosities, no edema bilaterally, normal temperatures of the legs and arms, no clubbing, cyanosis and good capillary refill.  Lymphatic: palpation is normal.  Spine/Pelvis examination:  Inspection:  Overall, sacoiliac joint benign and hips nontender; without crepitus or defects.   Thoracic spine inspection: Alignment  normal without kyphosis present   Lumbar spine inspection:  Alignment  with normal lumbar lordosis, without scoliosis apparent.   Thoracic spine palpation:  without tenderness of spinal processes   Lumbar spine palpation: without tenderness of lumbar area; without tightness of lumbar muscles    Range of Motion:   Lumbar flexion, forward flexion is normal without pain or tenderness    Lumbar extension is full without pain or tenderness   Left lateral bend is normal without pain or tenderness   Right lateral bend is normal without pain or tenderness   Straight leg raising is normal  Strength & tone: normal   Stability overall normal stability  The right dorsal wrist is tender over the fourth compartment with no swelling.  ROM of the wrist is only 10 degrees dorsiflexion and 20 volar flexion.  NV intact. Grip is decreased secondary to pain.  There is no redness.    All other systems reviewed and are negative   The patient has been educated about the nature of the problem(s) and counseled on treatment options.  The patient appeared to understand what I have discussed and is in agreement with it.  Encounter Diagnoses  Name Primary?  . Other intervertebral disc degeneration, lumbar region Yes  . Right wrist pain     PLAN Call if any problems.  Precautions discussed.  Continue current medications.   Schedule epidural.  Begin cock-up splint.  Aleve one bid pc.  Return to clinic 1 month   Electronically Signed Darreld Mclean, MD 2/26/20208:36 AM

## 2018-12-21 ENCOUNTER — Ambulatory Visit: Payer: BLUE CROSS/BLUE SHIELD | Admitting: Orthopaedic Surgery

## 2019-08-24 ENCOUNTER — Emergency Department (HOSPITAL_COMMUNITY)
Admission: EM | Admit: 2019-08-24 | Discharge: 2019-08-24 | Disposition: A | Discharge status: Home / Self Care | Payer: Self-pay | Attending: Emergency Medicine | Admitting: Emergency Medicine

## 2019-08-24 ENCOUNTER — Other Ambulatory Visit: Payer: Self-pay

## 2019-08-24 ENCOUNTER — Encounter (HOSPITAL_COMMUNITY): Payer: Self-pay | Admitting: Emergency Medicine

## 2019-08-24 DIAGNOSIS — U071 COVID-19: Secondary | ICD-10-CM

## 2019-08-24 DIAGNOSIS — Z20828 Contact with and (suspected) exposure to other viral communicable diseases: Secondary | ICD-10-CM | POA: Insufficient documentation

## 2019-08-24 DIAGNOSIS — F17210 Nicotine dependence, cigarettes, uncomplicated: Secondary | ICD-10-CM | POA: Insufficient documentation

## 2019-08-24 DIAGNOSIS — R519 Headache, unspecified: Secondary | ICD-10-CM | POA: Insufficient documentation

## 2019-08-24 DIAGNOSIS — M7918 Myalgia, other site: Secondary | ICD-10-CM | POA: Insufficient documentation

## 2019-08-24 DIAGNOSIS — J069 Acute upper respiratory infection, unspecified: Secondary | ICD-10-CM | POA: Insufficient documentation

## 2019-08-24 MED ORDER — BENZONATATE 100 MG PO CAPS: 100.0000 mg | ORAL_CAPSULE | Freq: Three times a day (TID) | ORAL | 0 refills | Status: DC

## 2019-08-24 MED ORDER — ONDANSETRON 4 MG PO TBDP: 4.0000 mg | ORAL_TABLET | Freq: Three times a day (TID) | ORAL | 0 refills | Status: AC | PRN

## 2019-08-24 MED ORDER — ALBUTEROL SULFATE HFA 108 (90 BASE) MCG/ACT IN AERS
2.0000 | INHALATION_SPRAY | RESPIRATORY_TRACT | Status: AC
  Administered 2019-08-24: 2 via RESPIRATORY_TRACT
  Filled 2019-08-24: qty 6.7

## 2019-08-24 MED ORDER — AEROCHAMBER Z-STAT PLUS/MEDIUM MISC: 1.0000 | Freq: Once | Status: DC

## 2019-08-24 NOTE — ED Provider Notes (Signed)
[facility] Provider [facility]   CSN: [id number] Arrival date & time: 08/24/19  0018     History   Chief Complaint Chief Complaint  Patient presents with  . Multiple Complaints    HPI Lauren MayotteJessica D Castaneda is a 33 y.o. female.     HPI  33 year old female, history of asthma as a child, no longer on medications presents with approximately 24 hours of symptoms that started with a runny nose and progressed into having some coughing, headache, body aches.  She has been taking Mucinex fast max sinus and has not had a fever prehospital.  She denies any known sick contacts however approximately 6 hours ago she was tested for Covid, she does not yet have the results but is concerned that this may be more than that or wants to know if she is contagious to others.  She has not taken any other medications.  Her symptoms are persistent and associated with postnasal drip and an ongoing cough.  She has not had to use albuterol treatments in many years.  She does smoke 1 pack of cigarettes per day  Past Medical History:  Diagnosis Date  . Chronic back pain     There are no active problems to display for this patient.   Past Surgical History:  Procedure Laterality Date  . TUBAL LIGATION       OB History    Gravida  3   Para  3   Term  3   Preterm      AB      Living  3     SAB      TAB      Ectopic      Multiple      Live Births               Home Medications    Prior to Admission medications   Medication Sig Start Date End Date Taking? Authorizing Provider  benzonatate (TESSALON) 100 MG capsule Take 1 capsule (100 mg total) by mouth every 8 (eight) hours. 08/24/19   Eber HongMiller, Trevontae Lindahl, MD  diclofenac (VOLTAREN) 50 MG EC tablet Take 1 tablet (50 mg total) by mouth 2 (two) times daily. 05/17/18   Elson AreasSofia, Leslie K, PA-C  methocarbamol (ROBAXIN) 500 MG tablet Take 1 tablet (500 mg total) by mouth 2 (two) times daily. 05/17/18   Elson AreasSofia, Leslie K, PA-C  ondansetron  (ZOFRAN ODT) 4 MG disintegrating tablet Take 1 tablet (4 mg total) by mouth every 8 (eight) hours as needed for nausea. 08/24/19   Eber HongMiller, Cassity Christian, MD  Prenatal Vit-Fe Fumarate-FA (MULTIVITAMIN-PRENATAL) 27-0.8 MG TABS tablet Take 1 tablet by mouth daily at 12 noon.    [provider]    Family History No family history on file.  Social History Social History   Tobacco Use  . Smoking status: Current Every Day Smoker    Packs/day: 0.50    Types: Cigarettes  . Smokeless tobacco: Never Used  Substance Use Topics  . Alcohol use: Yes    Comment: socially  . Drug use: No     Allergies   Phenergan [promethazine hcl]   Review of Systems Review of Systems  All other systems reviewed and are negative.    Physical Exam Updated Vital Signs BP 117/72 (BP Location: Right Arm)   Pulse 72   Temp 98.8 F (37.1 C) (Oral)   Resp 19   SpO2 98%   Physical Exam Vitals signs and nursing note reviewed.  Constitutional:  General: She is not in acute distress.    Appearance: She is well-developed.  HENT:     Head: Normocephalic and atraumatic.     Nose:     Comments: Clear rhinorrhea    Mouth/Throat:     Pharynx: No oropharyngeal exudate.     Comments: Moist mucous membranes, clear posterior pharynx Eyes:     General: No scleral icterus.       Right eye: No discharge.        Left eye: No discharge.     Conjunctiva/sclera: Conjunctivae normal.     Pupils: Pupils are equal, round, and reactive to light.  Neck:     Musculoskeletal: Normal range of motion and neck supple.     Thyroid: No thyromegaly.     Vascular: No JVD.  Cardiovascular:     Rate and Rhythm: Normal rate and regular rhythm.     Heart sounds: Normal heart sounds. No murmur. No friction rub. No gallop.   Pulmonary:     Effort: Pulmonary effort is normal. No respiratory distress.     Breath sounds: Normal breath sounds. No wheezing or rales.  Abdominal:     General: Bowel sounds are normal. There is no  distension.     Palpations: Abdomen is soft. There is no mass.     Tenderness: There is no abdominal tenderness.  Musculoskeletal: Normal range of motion.        General: No tenderness.  Lymphadenopathy:     Cervical: No cervical adenopathy.  Skin:    General: Skin is warm and dry.     Findings: No erythema or rash.  Neurological:     Mental Status: She is alert.     Coordination: Coordination normal.  Psychiatric:        Behavior: Behavior normal.      ED Treatments / Results  Labs (all labs ordered are listed, but only abnormal results are displayed) Labs Reviewed - No data to display  EKG None  Radiology No results found.  Procedures Procedures (including critical care time)  Medications Ordered in ED Medications  albuterol (VENTOLIN HFA) 108 (90 Base) MCG/ACT inhaler 2 puff (has no administration in time range)  aerochamber Z-Stat Plus/medium 1 each (has no administration in time range)     Initial Impression / Assessment and Plan / ED Course  I have reviewed the triage vital signs and the nursing notes.  Pertinent labs & imaging results that were available during my care of the patient were reviewed by me and considered in my medical decision making (see chart for details).        Occasional coughing throughout the exam, otherwise the patient is well-appearing, she has vital signs which are unremarkable with no fever no tachycardia no hypoxia and normal blood pressure.  She likely does have coronavirus infection, she has been instructed on the appropriate quarantine procedures and will be given medications including Tessalon, albuterol and an antiemetic as needed.  She has been nauseated but is not vomiting.  She is agreeable to the plan, she appears stable for discharge, I do not think she needs advanced imaging as she does not likely have pneumonia.  I do not think antibacterial medications are necessary at this time either.  Lauren Castaneda was evaluated in  Emergency Department on 08/24/2019 for the symptoms described in the history of present illness. She was evaluated in the context of the global COVID-19 pandemic, which necessitated consideration that the patient might be at risk for  infection with the SARS-CoV-2 virus that causes COVID-19. Institutional protocols and algorithms that pertain to the evaluation of patients at risk for COVID-19 are in a state of rapid change based on information released by regulatory bodies including the CDC and federal and state organizations. These policies and algorithms were followed during the patient's care in the ED.   Final Clinical Impressions(s) / ED Diagnoses   Final diagnoses:  COVID-19  Viral upper respiratory tract infection    ED Discharge Orders         Ordered    benzonatate (TESSALON) 100 MG capsule  Every 8 hours     08/24/19 0052    ondansetron (ZOFRAN ODT) 4 MG disintegrating tablet  Every 8 hours PRN     08/24/19 0052           Noemi Chapel, MD 08/24/19 [phone]

## 2019-08-24 NOTE — ED Triage Notes (Signed)
Pt C/O sore throat, runny nose, cough, headache, and nausea that started yesterday. Pt had COVID swab yesterday and awaiting results.

## 2019-08-24 NOTE — Discharge Instructions (Signed)
Tessalon, 1 tablet every 8 hours as needed for coughing  Albuterol 2 puffs every 4 hours as needed for coughing shortness of breath or wheezing  You may use over-the-counter medications such as DayQuil, NyQuil or Mucinex to help with coughing and fevers.  You will be contagious for up to 10 days from the first day of your illness.  You may only return to work and get out of quarantine if you are fever free after 24 hours with no medication, improving symptoms and at minimum 10 days from the first day of your illness.  Anybody who has been around you in the last week needs to quarantine for 14 days to make sure they do not develop symptoms.  Emergency department for severe or worsening symptoms.

## 2021-02-12 ENCOUNTER — Emergency Department (HOSPITAL_COMMUNITY)
Admission: EM | Admit: 2021-02-12 | Discharge: 2021-02-12 | Disposition: A | Discharge status: Home / Self Care | Payer: Worker's Compensation | Attending: Emergency Medicine | Admitting: Emergency Medicine

## 2021-02-12 ENCOUNTER — Encounter (HOSPITAL_COMMUNITY): Payer: Self-pay | Admitting: *Deleted

## 2021-02-12 ENCOUNTER — Other Ambulatory Visit: Payer: Self-pay

## 2021-02-12 ENCOUNTER — Emergency Department (HOSPITAL_COMMUNITY): Payer: Worker's Compensation

## 2021-02-12 DIAGNOSIS — M545 Low back pain, unspecified: Secondary | ICD-10-CM

## 2021-02-12 DIAGNOSIS — W108XXA Fall (on) (from) other stairs and steps, initial encounter: Secondary | ICD-10-CM | POA: Insufficient documentation

## 2021-02-12 DIAGNOSIS — M51369 Other intervertebral disc degeneration, lumbar region without mention of lumbar back pain or lower extremity pain: Secondary | ICD-10-CM

## 2021-02-12 DIAGNOSIS — F17210 Nicotine dependence, cigarettes, uncomplicated: Secondary | ICD-10-CM | POA: Insufficient documentation

## 2021-02-12 DIAGNOSIS — W19XXXA Unspecified fall, initial encounter: Secondary | ICD-10-CM

## 2021-02-12 DIAGNOSIS — Y990 Civilian activity done for income or pay: Secondary | ICD-10-CM | POA: Insufficient documentation

## 2021-02-12 DIAGNOSIS — M5136 Other intervertebral disc degeneration, lumbar region: Secondary | ICD-10-CM | POA: Insufficient documentation

## 2021-02-12 LAB — PREGNANCY, URINE: Preg Test, Ur: NEGATIVE

## 2021-02-12 MED ORDER — KETOROLAC TROMETHAMINE 30 MG/ML IJ SOLN
30.0000 mg | Freq: Once | INTRAMUSCULAR | Status: AC
  Administered 2021-02-12: 30 mg via INTRAMUSCULAR
  Filled 2021-02-12: qty 1

## 2021-02-12 MED ORDER — DICLOFENAC SODIUM 50 MG PO TBEC: 50.0000 mg | DELAYED_RELEASE_TABLET | Freq: Two times a day (BID) | ORAL | 0 refills | Status: DC

## 2021-02-12 MED ORDER — METHOCARBAMOL 500 MG PO TABS: 500.0000 mg | ORAL_TABLET | Freq: Two times a day (BID) | ORAL | 0 refills | Status: DC

## 2021-02-12 NOTE — Discharge Instructions (Addendum)
Take diclofenac and Robaxin as needed as prescribed.  Recommend recheck with Worker's Comp. provider in 2 days.

## 2021-02-12 NOTE — ED Triage Notes (Signed)
Pt fell off a lift that was about 3.5 feet up in the air, landing on her feet and tried to catch herself.  Denies hitting her head.  C/o lower back pain since.

## 2021-02-12 NOTE — ED Provider Notes (Signed)
[facility] EMERGENCY DEPARTMENT Provider Note   CSN: [id number] Arrival date & time: 02/12/21  1245     History Chief Complaint  Patient presents with  . Fall    Lauren Castaneda is a 35 y.o. female.  35 year old female with complaint of low back pain.  Patient was at work today on a lift and when she went to step off the left down to the ground she missed the step landing approximately 3-1/2 feet down on her feet setting and impact of her back resulting in pain in her back.  Reports history of degenerative disc disease and is concerned she has injured her back.  No other injuries, complaints, concerns.  Patient has been ambulatory since the fall without difficulty.        Past Medical History:  Diagnosis Date  . Chronic back pain     There are no problems to display for this patient.   Past Surgical History:  Procedure Laterality Date  . TUBAL LIGATION       OB History    Gravida  3   Para  3   Term  3   Preterm      AB      Living  3     SAB      IAB      Ectopic      Multiple      Live Births              History reviewed. No pertinent family history.  Social History   Tobacco Use  . Smoking status: Current Every Day Smoker    Packs/day: 1.00    Types: Cigarettes  . Smokeless tobacco: Never Used  Substance Use Topics  . Alcohol use: Yes    Comment: socially  . Drug use: No    Home Medications Prior to Admission medications   Medication Sig Start Date End Date Taking? Authorizing Provider  benzonatate (TESSALON) 100 MG capsule Take 1 capsule (100 mg total) by mouth every 8 (eight) hours. 08/24/19   Eber Hong, MD  diclofenac (VOLTAREN) 50 MG EC tablet Take 1 tablet (50 mg total) by mouth 2 (two) times daily. 02/12/21   Jeannie Fend, PA-C  methocarbamol (ROBAXIN) 500 MG tablet Take 1 tablet (500 mg total) by mouth 2 (two) times daily. 02/12/21   Jeannie Fend, PA-C  ondansetron (ZOFRAN ODT) 4 MG disintegrating tablet Take 1  tablet (4 mg total) by mouth every 8 (eight) hours as needed for nausea. 08/24/19   Eber Hong, MD  Prenatal Vit-Fe Fumarate-FA (MULTIVITAMIN-PRENATAL) 27-0.8 MG TABS tablet Take 1 tablet by mouth daily at 12 noon.    [provider]    Allergies    Phenergan [promethazine hcl]  Review of Systems   Review of Systems  Constitutional: Negative for fever.  Gastrointestinal: Negative for abdominal pain.  Musculoskeletal: Positive for back pain. Negative for gait problem, neck pain and neck stiffness.  Skin: Negative for rash and wound.  Allergic/Immunologic: Negative for immunocompromised state.  Neurological: Negative for weakness and numbness.  Psychiatric/Behavioral: Negative for confusion.    Physical Exam Updated Vital Signs BP 125/80 (BP Location: Right Arm)   Pulse 60   Temp 98.6 F (37 C) (Oral)   Resp 16   Ht 5\' 4"  (1.626 m)   Wt 80.7 kg   LMP 01/23/2021   SpO2 99%   BMI 30.55 kg/m   Physical Exam Vitals and nursing note reviewed.  Constitutional:  General: She is not in acute distress.    Appearance: She is well-developed. She is not diaphoretic.  HENT:     Head: Normocephalic and atraumatic.  Pulmonary:     Effort: Pulmonary effort is normal.  Musculoskeletal:        General: Tenderness present. No swelling or deformity.     Cervical back: No tenderness or bony tenderness.     Thoracic back: No tenderness or bony tenderness.     Lumbar back: Tenderness present. No bony tenderness. Normal range of motion.       Back:     Comments: Tenderness across lower back without crepitus, equal leg strength and reflexes, sensation intact.   Skin:    General: Skin is warm and dry.     Findings: No erythema or rash.  Neurological:     Mental Status: She is alert and oriented to person, place, and time.     Sensory: No sensory deficit.     Motor: No weakness.     Gait: Gait normal.  Psychiatric:        Behavior: Behavior normal.     ED Results /  Procedures / Treatments   Labs (all labs ordered are listed, but only abnormal results are displayed) Labs Reviewed  PREGNANCY, URINE    EKG None  Radiology DG Lumbar Spine Complete  Result Date: 02/12/2021 CLINICAL DATA:  Lumbosacral back pain after fall. EXAM: LUMBAR SPINE - COMPLETE 4+ VIEW COMPARISON:  Radiograph 05/17/2018 FINDINGS: The alignment is maintained. Vertebral body heights are normal. There is no listhesis. The posterior elements are intact. No fracture. Mild disc space narrowing and endplate spurring at L4-L5, with slight progression from 2019. Progression of facet hypertrophy at this level. Sacroiliac joints are symmetric and normal. IMPRESSION: 1. No acute fracture of the lumbar spine. 2. Mild progression of degenerative disc disease and facet hypertrophy at L4-L5 since 2019. Electronically Signed   By: Narda Rutherford M.D.   On: 02/12/2021 16:46    Procedures Procedures   Medications Ordered in ED Medications  ketorolac (TORADOL) 30 MG/ML injection 30 mg (has no administration in time range)    ED Course  I have reviewed the triage vital signs and the nursing notes.  Pertinent labs & imaging results that were available during my care of the patient were reviewed by me and considered in my medical decision making (see chart for details).  Clinical Course as of 02/12/21 1651  Tue Feb 12, 2021  [id number] 35 year old female with complaint of low back pain after fall landing on her feet at work with underlying degenerative disc disease.  On exam, tenderness across her low back, no radicular symptoms at this time, leg strength equal, sensation intact, reflexes symmetric.  X-ray shows progression of degenerative disc disease.  Patient is discharged with prescription for Robaxin and diclofenac and advised to recheck with Worker's Comp. provider. [LM]    Clinical Course User Index [LM] [name]   MDM Rules/Calculators/A&P                          Final  Clinical Impression(s) / ED Diagnoses Final diagnoses:  Fall, initial encounter  Acute bilateral low back pain without sciatica  Degenerative disc disease, lumbar    Rx / DC Orders ED Discharge Orders         Ordered    methocarbamol (ROBAXIN) 500 MG tablet  2 times daily  02/12/21 1649    diclofenac (VOLTAREN) 50 MG EC tablet  2 times daily        02/12/21 1649           [name] 02/12/21 1651    Eber Hong, MD 02/13/21 [phone]

## 2021-05-10 ENCOUNTER — Emergency Department (HOSPITAL_COMMUNITY): Payer: Self-pay

## 2021-05-10 ENCOUNTER — Encounter (HOSPITAL_COMMUNITY): Payer: Self-pay | Admitting: Emergency Medicine

## 2021-05-10 ENCOUNTER — Emergency Department (HOSPITAL_COMMUNITY)
Admission: EM | Admit: 2021-05-10 | Discharge: 2021-05-10 | Disposition: A | Discharge status: Home / Self Care | Payer: Self-pay | Attending: Emergency Medicine | Admitting: Emergency Medicine

## 2021-05-10 DIAGNOSIS — R002 Palpitations: Secondary | ICD-10-CM | POA: Insufficient documentation

## 2021-05-10 DIAGNOSIS — M546 Pain in thoracic spine: Secondary | ICD-10-CM | POA: Insufficient documentation

## 2021-05-10 DIAGNOSIS — N9489 Other specified conditions associated with female genital organs and menstrual cycle: Secondary | ICD-10-CM | POA: Insufficient documentation

## 2021-05-10 DIAGNOSIS — R0602 Shortness of breath: Secondary | ICD-10-CM | POA: Insufficient documentation

## 2021-05-10 DIAGNOSIS — M542 Cervicalgia: Secondary | ICD-10-CM | POA: Insufficient documentation

## 2021-05-10 DIAGNOSIS — H5789 Other specified disorders of eye and adnexa: Secondary | ICD-10-CM | POA: Insufficient documentation

## 2021-05-10 DIAGNOSIS — R079 Chest pain, unspecified: Secondary | ICD-10-CM | POA: Insufficient documentation

## 2021-05-10 DIAGNOSIS — R519 Headache, unspecified: Secondary | ICD-10-CM | POA: Insufficient documentation

## 2021-05-10 DIAGNOSIS — F17210 Nicotine dependence, cigarettes, uncomplicated: Secondary | ICD-10-CM | POA: Insufficient documentation

## 2021-05-10 DIAGNOSIS — Z20822 Contact with and (suspected) exposure to covid-19: Secondary | ICD-10-CM | POA: Insufficient documentation

## 2021-05-10 HISTORY — DX: Unspecified osteoarthritis, unspecified site: M19.90

## 2021-05-10 LAB — COMPREHENSIVE METABOLIC PANEL
ALT: 15 U/L (ref 0–44)
AST: 16 U/L (ref 15–41)
Albumin: 3.7 g/dL (ref 3.5–5.0)
Alkaline Phosphatase: 45 U/L (ref 38–126)
Anion gap: 8 (ref 5–15)
BUN: 7 mg/dL (ref 6–20)
CO2: 22 mmol/L (ref 22–32)
Calcium: 8.5 mg/dL — ABNORMAL LOW (ref 8.9–10.3)
Chloride: 108 mmol/L (ref 98–111)
Creatinine, Ser: 0.74 mg/dL (ref 0.44–1.00)
GFR, Estimated: >60 mL/min (ref >60)
Glucose, Bld: 102 mg/dL — ABNORMAL HIGH (ref 70–99)
Potassium: 3.4 mmol/L — ABNORMAL LOW (ref 3.5–5.1)
Sodium: 138 mmol/L (ref 135–145)
Total Bilirubin: 0.6 mg/dL (ref 0.3–1.2)
Total Protein: 6.2 g/dL — ABNORMAL LOW (ref 6.5–8.1)

## 2021-05-10 LAB — CBC WITH DIFFERENTIAL/PLATELET
Abs Immature Granulocytes: 0.01 10*3/uL (ref 0.00–0.07)
Basophils Absolute: 0.1 10*3/uL (ref 0.0–0.1)
Basophils Relative: 1 %
Eosinophils Absolute: 0.2 10*3/uL (ref 0.0–0.5)
Eosinophils Relative: 4 %
HCT: 42.1 % (ref 36.0–46.0)
Hemoglobin: 14.2 g/dL (ref 12.0–15.0)
Immature Granulocytes: 0 %
Lymphocytes Relative: 36 %
Lymphs Abs: 2.3 10*3/uL (ref 0.7–4.0)
MCH: 33.3 pg (ref 26.0–34.0)
MCHC: 33.7 g/dL (ref 30.0–36.0)
MCV: 98.6 fL (ref 80.0–100.0)
Monocytes Absolute: 0.4 10*3/uL (ref 0.1–1.0)
Monocytes Relative: 6 %
Neutro Abs: 3.5 10*3/uL (ref 1.7–7.7)
Neutrophils Relative %: 53 %
Platelets: 137 10*3/uL — ABNORMAL LOW (ref 150–400)
RBC: 4.27 MIL/uL (ref 3.87–5.11)
RDW: 11.9 % (ref 11.5–15.5)
WBC: 6.5 10*3/uL (ref 4.0–10.5)
nRBC: 0 % (ref 0.0–0.2)

## 2021-05-10 LAB — URINALYSIS, ROUTINE W REFLEX MICROSCOPIC
Bacteria, UA: NONE SEEN
Bilirubin Urine: NEGATIVE
Glucose, UA: NEGATIVE mg/dL
Ketones, ur: NEGATIVE mg/dL
Leukocytes,Ua: NEGATIVE
Nitrite: NEGATIVE
Protein, ur: NEGATIVE mg/dL
Specific Gravity, Urine: 1.04 — ABNORMAL HIGH (ref 1.005–1.030)
pH: 5 (ref 5.0–8.0)

## 2021-05-10 LAB — RESP PANEL BY RT-PCR (FLU A&B, COVID) ARPGX2
Influenza A by PCR: NEGATIVE
Influenza B by PCR: NEGATIVE
SARS Coronavirus 2 by RT PCR: NEGATIVE

## 2021-05-10 LAB — I-STAT BETA HCG BLOOD, ED (MC, WL, AP ONLY): I-stat hCG, quantitative: <5 m[IU]/mL (ref <5)

## 2021-05-10 LAB — CK: Total CK: 65 U/L (ref 38–234)

## 2021-05-10 LAB — TROPONIN I (HIGH SENSITIVITY)
Troponin I (High Sensitivity): <2 ng/L (ref <18)
Troponin I (High Sensitivity): <2 ng/L (ref <18)

## 2021-05-10 MED ORDER — KETOROLAC TROMETHAMINE 15 MG/ML IJ SOLN
15.0000 mg | Freq: Once | INTRAMUSCULAR | Status: AC
  Administered 2021-05-10: 15 mg via INTRAVENOUS
  Filled 2021-05-10: qty 1

## 2021-05-10 MED ORDER — ERYTHROMYCIN 5 MG/GM OP OINT: TOPICAL_OINTMENT | OPHTHALMIC | 0 refills | Status: DC

## 2021-05-10 MED ORDER — TETRACAINE HCL 0.5 % OP SOLN
2.0000 [drp] | Freq: Once | OPHTHALMIC | Status: DC
  Filled 2021-05-10: qty 4

## 2021-05-10 MED ORDER — BENZONATATE 100 MG PO CAPS: 100.0000 mg | ORAL_CAPSULE | Freq: Three times a day (TID) | ORAL | 0 refills | Status: DC

## 2021-05-10 MED ORDER — SODIUM CHLORIDE 0.9 % IV BOLUS
500.0000 mL | Freq: Once | INTRAVENOUS | Status: AC
  Administered 2021-05-10: 500 mL via INTRAVENOUS

## 2021-05-10 MED ORDER — SODIUM CHLORIDE 0.9 % IV BOLUS
1000.0000 mL | Freq: Once | INTRAVENOUS | Status: AC
  Administered 2021-05-10: 1000 mL via INTRAVENOUS

## 2021-05-10 MED ORDER — IOHEXOL 350 MG/ML SOLN
65.0000 mL | Freq: Once | INTRAVENOUS | Status: AC | PRN
  Administered 2021-05-10: 65 mL via INTRAVENOUS

## 2021-05-10 MED ORDER — FLUORESCEIN SODIUM 1 MG OP STRP
1.0000 | ORAL_STRIP | Freq: Once | OPHTHALMIC | Status: DC
  Filled 2021-05-10: qty 1

## 2021-05-10 MED ORDER — DIPHENHYDRAMINE HCL 50 MG/ML IJ SOLN
12.5000 mg | Freq: Once | INTRAMUSCULAR | Status: AC
  Administered 2021-05-10: 12.5 mg via INTRAVENOUS
  Filled 2021-05-10: qty 1

## 2021-05-10 MED ORDER — METHOCARBAMOL 500 MG PO TABS: 500.0000 mg | ORAL_TABLET | Freq: Three times a day (TID) | ORAL | 0 refills | Status: DC | PRN

## 2021-05-10 MED ORDER — NAPROXEN 500 MG PO TABS: 500.0000 mg | ORAL_TABLET | Freq: Two times a day (BID) | ORAL | 0 refills | Status: DC | PRN

## 2021-05-10 MED ORDER — METOCLOPRAMIDE HCL 5 MG/ML IJ SOLN
10.0000 mg | Freq: Once | INTRAMUSCULAR | Status: AC
  Administered 2021-05-10: 10 mg via INTRAVENOUS
  Filled 2021-05-10: qty 2

## 2021-05-10 MED ORDER — ACETAMINOPHEN 325 MG PO TABS
650.0000 mg | ORAL_TABLET | Freq: Once | ORAL | Status: AC
  Administered 2021-05-10: 650 mg via ORAL
  Filled 2021-05-10: qty 2

## 2021-05-10 NOTE — ED Triage Notes (Signed)
Patient complains of neck pain hat started approximately one week ago, then two days ago she states she started feel like her heart was racing and fatigue that "comes in waves". Also reports nausea and intermittent. Patient works as a Electrical engineer. Patient is not vaccinated against COVID, states she did an at-home COVID test that was negative two days ago.

## 2021-05-10 NOTE — Discharge Instructions (Addendum)
You were seen in the emergency department today for chest pain as well as several other symptoms.  Your work-up was overall reassuring.  Your potassium and calcium were mildly low, please include foods in your diet that are rich in his electrolytes.  Your COVID test was negative.  Your CT scans were reassuring.  We suspect that you have some type of viral illness, please be sure to stay well-hydrated drinking electrolyte containing solutions.  We are sending you home with the following medicines to help your symptoms:  - Naproxen is a nonsteroidal anti-inflammatory medication that will help with pain and swelling. Be sure to take this medication as prescribed with food, 1 pill every 12 hours,  It should be taken with food, as it can cause stomach upset, and more seriously, stomach bleeding. Do not take other nonsteroidal anti-inflammatory medications with this such as Advil, Motrin, Aleve, Mobic, Goodie Powder, or Motrin.    - Robaxin is the muscle relaxer I have prescribed, this is meant to help with muscle tightness. Be aware that this medication may make you drowsy therefore the first time you take this it should be at a time you are in an environment where you can rest. Do not drive or operate heavy machinery when taking this medication. Do not drink alcohol or take other sedating medications with this medicine such as narcotics or benzodiazepines.   - Tessalon- take every 8 hours as needed for coughing.   We are also sending you home with erythromycin ointment to use 4 times per day to the lid of your right eye for the next 1 week.  Please apply warm compresses to the eyelid frequently.  Please follow-up with an ophthalmologist for further evaluation of this.  You make take Tylenol per over the counter dosing with these medications.   We have prescribed you new medication(s) today. Discuss the medications prescribed today with your pharmacist as they can have adverse effects and interactions with  your other medicines including over the counter and prescribed medications. Seek medical evaluation if you start to experience new or abnormal symptoms after taking one of these medicines, seek care immediately if you start to experience difficulty breathing, feeling of your throat closing, facial swelling, or rash as these could be indications of a more serious allergic reaction   Please follow-up with primary care within 3 days for reevaluation.  Return to the emergency department for new or worsening symptoms including but not limited to new or worsening pain, fever, new rashes, inability to keep fluids down, passing out, trouble breathing, coughing up blood, or any other concerns

## 2021-05-10 NOTE — ED Provider Notes (Signed)
Emergency Medicine Provider Triage Evaluation Note  Lauren Castaneda , a 35 y.o. female  was evaluated in triage.  Pt complains of neck pain that began a week ago and then intermittent fatigue, palpitations, cough associated nausea and vomiting.  States very little effort or exertion makes her feel extremely fatigued when she ran a marathon. States whole body feels stiff  Review of Systems  Positive: Fatigue, palpitations, neck pain Negative: fever  Physical Exam  BP 112/77 (BP Location: Right Arm)   Pulse 61   Temp 98.7 F (37.1 C) (Oral)   Resp 16   LMP 05/08/2021   SpO2 100%  Gen:   Awake, no distress   Resp:  Normal effort  MSK:   Moves extremities without difficulty    Medical Decision Making  Medically screening exam initiated at 9:37 AM.  Appropriate orders placed.  Lauren Castaneda was informed that the remainder of the evaluation will be completed by another provider, this initial triage assessment does not replace that evaluation, and the importance of remaining in the ED until their evaluation is complete.  Labs, cxr   Alveria Apley, PA-C 05/10/21 1610    Cheryll Cockayne, MD 05/10/21 [phone]

## 2021-05-10 NOTE — ED Provider Notes (Signed)
[facility] EMERGENCY DEPARTMENT Provider Note   CSN: [id number] Arrival date & time: 05/10/21  9924     History Chief Complaint  Patient presents with   Chest Pain    Lauren Castaneda is a 35 y.o. female with a history of tobacco abuse and tubal ligation who presents to the emergency department with complaints of chest pain that began last night.  Patient states the pain is intermittent and associated with shortness of breath and palpitations- she feels like her heart is racing.  She also feels fatigued with headache, nausea, diarrhea, congestion, cough, and myalgias. No alleviating/aggravating factors. No intervention PTA. She also mentions upper back/neck pain x 1 week, no direct injury.  Recently traveled to [place] to visit a friend and symptoms started while she was returning.  She denies fever, vomiting, abdominal pain, syncope, melena, hematochezia, dysuria, or rashes. No recent tic bites. No recent foreign travel.  Denies fever, IVDU, numbness, focal weakness, incontinence, or saddle anesthesia.  She also mentions that her right upper eyelid has been irritated. Denies loss vision, diplopia, or drainage.   HPI     Past Medical History:  Diagnosis Date   Arthritis    Chronic back pain     There are no problems to display for this patient.   Past Surgical History:  Procedure Laterality Date   TUBAL LIGATION       OB History     Gravida  3   Para  3   Term  3   Preterm      AB      Living  3      SAB      IAB      Ectopic      Multiple      Live Births              History reviewed. No pertinent family history.  Social History   Tobacco Use   Smoking status: Every Day    Packs/day: 1.00    Types: Cigarettes   Smokeless tobacco: Never  Substance Use Topics   Alcohol use: Yes    Comment: socially   Drug use: No    Home Medications Prior to Admission medications   Medication Sig Start Date End Date Taking?  Authorizing Provider  benzonatate (TESSALON) 100 MG capsule Take 1 capsule (100 mg total) by mouth every 8 (eight) hours. 08/24/19   Eber Hong, MD  diclofenac (VOLTAREN) 50 MG EC tablet Take 1 tablet (50 mg total) by mouth 2 (two) times daily. 02/12/21   Jeannie Fend, PA-C  methocarbamol (ROBAXIN) 500 MG tablet Take 1 tablet (500 mg total) by mouth 2 (two) times daily. 02/12/21   Jeannie Fend, PA-C  ondansetron (ZOFRAN ODT) 4 MG disintegrating tablet Take 1 tablet (4 mg total) by mouth every 8 (eight) hours as needed for nausea. 08/24/19   Eber Hong, MD  Prenatal Vit-Fe Fumarate-FA (MULTIVITAMIN-PRENATAL) 27-0.8 MG TABS tablet Take 1 tablet by mouth daily at 12 noon.    [provider]    Allergies    Phenergan [promethazine hcl]  Review of Systems   Review of Systems  Constitutional:  Positive for fatigue. Negative for chills and fever.  Respiratory:  Positive for cough and shortness of breath.   Cardiovascular:  Positive for chest pain and palpitations. Negative for leg swelling.  Gastrointestinal:  Positive for diarrhea and nausea. Negative for abdominal pain, blood in stool, constipation and vomiting.  Genitourinary:  Positive  for vaginal bleeding (currently menstruating). Negative for dysuria.  Musculoskeletal:  Positive for back pain, myalgias and neck pain.  Neurological:  Negative for syncope, weakness and numbness.       Negative for incontinence or saddle anesthesia.  All other systems reviewed and are negative.  Physical Exam Updated Vital Signs BP 102/71   Pulse (!) 44   Temp 98.7 F (37.1 C) (Oral)   Resp 17   LMP 05/08/2021   SpO2 98%   Physical Exam Vitals and nursing note reviewed.  Constitutional:      General: She is not in acute distress.    Appearance: Normal appearance. She is well-developed. She is not toxic-appearing.  HENT:     Head: Normocephalic and atraumatic.     Right Ear: Ear canal normal. Tympanic membrane is not perforated,  erythematous, retracted or bulging.     Left Ear: Ear canal normal. Tympanic membrane is not perforated, erythematous, retracted or bulging.     Ears:     Comments: No mastoid erythema/swelling/tenderness.     Nose:     Right Sinus: No maxillary sinus tenderness or frontal sinus tenderness.     Left Sinus: No maxillary sinus tenderness or frontal sinus tenderness.     Mouth/Throat:     Pharynx: Oropharynx is clear. Uvula midline. No oropharyngeal exudate or posterior oropharyngeal erythema.     Comments: Posterior oropharynx is symmetric appearing. Patient tolerating own secretions without difficulty. No trismus. No drooling. No hot potato voice. No swelling beneath the tongue, submandibular compartment is soft.  Eyes:     General: Vision grossly intact. Gaze aligned appropriately.        Right eye: No discharge.        Left eye: No discharge.     Extraocular Movements: Extraocular movements intact.     Conjunctiva/sclera: Conjunctivae normal.     Pupils: Pupils are equal, round, and reactive to light.     Comments: No proptosis.  Vision grossly intact.  No periorbital erythema/swelling/tenderness.  Fluorescein stain without uptake.  On further examination of the right upper eyelid patient has a very small hordeolum to the upper inner lid.  There is no purulent drainage.  There is no fluctuance.  Cardiovascular:     Rate and Rhythm: Normal rate and regular rhythm.     Heart sounds: No murmur heard. Pulmonary:     Effort: Pulmonary effort is normal. No respiratory distress.     Breath sounds: Normal breath sounds. No wheezing, rhonchi or rales.  Abdominal:     General: There is no distension.     Palpations: Abdomen is soft.     Tenderness: There is no abdominal tenderness. There is no guarding or rebound.  Musculoskeletal:     Cervical back: Normal range of motion and neck supple. No edema or rigidity. Muscular tenderness (bilateral lower cervical and upper thoracic) present. No spinous  process tenderness.     Right lower leg: No tenderness. No edema.     Left lower leg: No tenderness. No edema.  Lymphadenopathy:     Cervical: No cervical adenopathy.  Skin:    General: Skin is warm and dry.     Findings: No rash.  Neurological:     Mental Status: She is alert.     Comments: Alert. Clear speech. No facial droop. CNIII-XII grossly intact. Bilateral upper and lower extremities' sensation grossly intact. 5/5 symmetric strength with grip strength and with plantar and dorsi flexion bilaterally . Normal finger to nose bilaterally.  Negative pronator drift. Gait intact.    Psychiatric:        Mood and Affect: Mood normal.        Behavior: Behavior normal.    ED Results / Procedures / Treatments   Labs (all labs ordered are listed, but only abnormal results are displayed) Labs Reviewed  CBC WITH DIFFERENTIAL/PLATELET - Abnormal; Notable for the following components:      Result Value   Platelets 137 (*)    All other components within normal limits  COMPREHENSIVE METABOLIC PANEL - Abnormal; Notable for the following components:   Potassium 3.4 (*)    Glucose, Bld 102 (*)    Calcium 8.5 (*)    Total Protein 6.2 (*)    All other components within normal limits  RESP PANEL BY RT-PCR (FLU A&B, COVID) ARPGX2  CK  I-STAT BETA HCG BLOOD, ED ([facility], [facility], [facility] ONLY)  TROPONIN I (HIGH SENSITIVITY)  TROPONIN I (HIGH SENSITIVITY)    EKG None  Radiology DG Chest 2 View  Result Date: 05/10/2021 CLINICAL DATA:  Chest pain soft EXAM: CHEST - 2 VIEW COMPARISON:  None. FINDINGS: Cardiac and mediastinal contours are within normal limits. Indeterminate nodular opacity of the right upper lobe. Lungs otherwise clear. No acute pleural abnormalities. IMPRESSION: Indeterminate nodular opacity of the right upper lobe. Recommend further evaluation with chest CT. No acute cardiopulmonary abnormality. Electronically Signed   By: Allegra LaiLeah  Strickland M.D.   On: 05/10/2021 10:19   CT HEAD WO CONTRAST  (5MM)  Result Date: 05/10/2021 CLINICAL DATA:  Headache, new or worsening, positional (Age 33-49y) EXAM: CT HEAD WITHOUT CONTRAST TECHNIQUE: Contiguous axial images were obtained from the base of the skull through the vertex without intravenous contrast. COMPARISON:  None. FINDINGS: Brain: No evidence of acute intracranial hemorrhage or extra-axial collection.No evidence of mass lesion/concern mass effect.The ventricles are normal in size. Vascular: No hyperdense vessel or unexpected calcification. Skull: Normal. Negative for fracture or focal lesion. Sinuses/Orbits: No acute finding. Other: None. IMPRESSION: No acute intracranial abnormality. Electronically Signed   By: Caprice RenshawJacob  Kahn M.D.   On: 05/10/2021 12:49   CT Angio Chest PE W/Cm &/Or Wo Cm  Result Date: 05/10/2021 CLINICAL DATA:  Chest pain or SOB, pleurisy or effusion suspected EXAM: CT ANGIOGRAPHY CHEST WITH CONTRAST TECHNIQUE: Multidetector CT imaging of the chest was performed using the standard protocol during bolus administration of intravenous contrast. Multiplanar CT image reconstructions and MIPs were obtained to evaluate the vascular anatomy. CONTRAST:  65mL OMNIPAQUE IOHEXOL 350 MG/ML SOLN COMPARISON:  None. FINDINGS: Cardiovascular: Satisfactory opacification of the pulmonary arteries to the segmental level. No evidence of pulmonary embolism. Normal heart size. No pericardial effusion. Mediastinum/Nodes: No enlarged mediastinal, hilar, or axillary lymph nodes. Thyroid gland, trachea, and esophagus demonstrate no significant findings. Lungs/Pleura: Bibasilar hypoventilatory changes. No focal airspace consolidation. The airways are clear. No suspicious pulmonary nodules or masses. Upper Abdomen: No acute abnormality. Musculoskeletal: No chest wall abnormality. No acute or significant osseous findings. Review of the MIP images confirms the above findings. IMPRESSION: No pulmonary embolism or other acute findings in the chest. Electronically  Signed   By: Caprice RenshawJacob  Kahn M.D.   On: 05/10/2021 12:55    Procedures Procedures   Medications Ordered in ED Medications  tetracaine (PONTOCAINE) 0.5 % ophthalmic solution 2 drop (has no administration in time range)  fluorescein ophthalmic strip 1 strip (has no administration in time range)  metoCLOPramide (REGLAN) injection 10 mg (10 mg Intravenous Given 05/10/21 1226)  diphenhydrAMINE (BENADRYL) injection 12.5  mg (12.5 mg Intravenous Given 05/10/21 1226)  sodium chloride 0.9 % bolus 1,000 mL (1,000 mLs Intravenous New Bag/Given 05/10/21 1231)  acetaminophen (TYLENOL) tablet 650 mg (650 mg Oral Given 05/10/21 1225)  iohexol (OMNIPAQUE) 350 MG/ML injection 65 mL (65 mLs Intravenous Contrast Given 05/10/21 1219)  ketorolac (TORADOL) 15 MG/ML injection 15 mg (15 mg Intravenous Given 05/10/21 1327)  sodium chloride 0.9 % bolus 500 mL (500 mLs Intravenous New Bag/Given 05/10/21 1328)    ED Course  I have reviewed the triage vital signs and the nursing notes.  Pertinent labs & imaging results that were available during my care of the patient were reviewed by me and considered in my medical decision making (see chart for details).    MDM Rules/Calculators/A&P                           Patient presents to the ED with complaints of chest pain and generally not feeling well.  She is nontoxic, resting comfortably, vitals without significant abnormality.  Additional history obtained:  Additional history obtained from chart review & nursing note review.   EKG: Sinus rhythm.   Lab Tests:  I reviewed and interpreted labs, which included:  CBC: Mild thrombocytopenia which is similar to prior on record CMP: Mild hypokalemia and hypocalcemia but no significant electrolyte derangement.  Renal function is preserved.  LFTs are within normal limits. Troponins: No elevation, flat CK: WNL Pregnancy test: Negative Urinalysis: Blood present, patient is currently menstruating COVID/Flu: negative  Imaging  Studies ordered:  I ordered imaging studies which included CT head & CTA of the chest, CXR done per triage protocol, I independently visualized and interpreted imaging which showed:  CXR: Indeterminate nodular opacity of the right upper lobe. Recommend further evaluation with chest CT. No acute cardiopulmonary abnormality.  CTA: No pulmonary embolism or other acute findings in the chest CT head: No acute intracranial abnormalities.   ED Course:  Patient with overall reassuring work-up in the emergency department today. Exam is without signs of AOM, AOE, or mastoiditis. Oropharyngeal exam is benign. No sinus tenderness. No meningeal signs. Lungs are CTA without focal adventitious sounds, no signs of increased work of breathing, CT imaging without infiltrate- doubt CAP.  CT angio is also negative for pulmonary embolism or other acute intrathoracic findings.   EKG without acute ischemia, troponins are flat, do not suspect ACS at this time.  Abdomen nontender w/o peritoneal signs.  No tick or rashes to suggest RMSF or other tickborne illness. CK is within normal limits therefore do not suspect acute rhabdomyolysis.  Labs without significant electrolyte derangement or critical anemia.  Urinalysis without UTI.  She has no focal neurologic deficits to suggest cord compression in terms of her neck pain-this seems more muscular in nature.  She has what appears to be an inner lid hordeolum to right upper eyelid, exam not consistent with periorbital/orbital cellulitis. Patient with a few soft BP readings while sleeping on her blood pressure cuff- repeat 107/64. Patient is feeling much better on reassessment.  Overall unclear definitive etiology patient symptoms, favor viral illness.  Will treat supportively.  PCP follow-up. I discussed results, treatment plan, need for follow-up, and return precautions with the patient. Provided opportunity for questions, patient confirmed understanding and is in agreement with plan.    Portions of this note were generated with Scientist, clinical (histocompatibility and immunogenetics). Dictation errors may occur despite best attempts at proofreading.  Final Clinical Impression(s) / ED Diagnoses Final diagnoses:  Chest pain, unspecified type    Rx / DC Orders ED Discharge Orders          Ordered    naproxen (NAPROSYN) 500 MG tablet  2 times daily PRN        05/10/21 1532    methocarbamol (ROBAXIN) 500 MG tablet  Every 8 hours PRN        05/10/21 1532    benzonatate (TESSALON) 100 MG capsule  Every 8 hours,   Status:  Discontinued        05/10/21 1532    erythromycin ophthalmic ointment        05/10/21 1533    benzonatate (TESSALON) 100 MG capsule  Every 8 hours        05/10/21 1535             Shaunte Tuft, [place], PA-C 05/10/21 1544    Arby Barrette, MD 05/26/21 1445

## 2021-10-02 ENCOUNTER — Other Ambulatory Visit: Payer: Self-pay

## 2021-10-02 ENCOUNTER — Emergency Department (HOSPITAL_COMMUNITY): Payer: Self-pay

## 2021-10-02 ENCOUNTER — Emergency Department (HOSPITAL_COMMUNITY)
Admission: EM | Admit: 2021-10-02 | Discharge: 2021-10-02 | Disposition: A | Discharge status: Home / Self Care | Payer: Self-pay | Attending: Emergency Medicine | Admitting: Emergency Medicine

## 2021-10-02 ENCOUNTER — Encounter (HOSPITAL_COMMUNITY): Payer: Self-pay | Admitting: Emergency Medicine

## 2021-10-02 DIAGNOSIS — R42 Dizziness and giddiness: Secondary | ICD-10-CM | POA: Insufficient documentation

## 2021-10-02 DIAGNOSIS — R079 Chest pain, unspecified: Secondary | ICD-10-CM

## 2021-10-02 DIAGNOSIS — Z20822 Contact with and (suspected) exposure to covid-19: Secondary | ICD-10-CM | POA: Insufficient documentation

## 2021-10-02 DIAGNOSIS — R0789 Other chest pain: Secondary | ICD-10-CM | POA: Insufficient documentation

## 2021-10-02 DIAGNOSIS — F17210 Nicotine dependence, cigarettes, uncomplicated: Secondary | ICD-10-CM | POA: Insufficient documentation

## 2021-10-02 LAB — CBC
HCT: 42.3 % (ref 36.0–46.0)
Hemoglobin: 14.2 g/dL (ref 12.0–15.0)
MCH: 32.7 pg (ref 26.0–34.0)
MCHC: 33.6 g/dL (ref 30.0–36.0)
MCV: 97.5 fL (ref 80.0–100.0)
Platelets: 113 10*3/uL — ABNORMAL LOW (ref 150–400)
RBC: 4.34 MIL/uL (ref 3.87–5.11)
RDW: 12.1 % (ref 11.5–15.5)
WBC: 7.3 10*3/uL (ref 4.0–10.5)
nRBC: 0 % (ref 0.0–0.2)

## 2021-10-02 LAB — COMPREHENSIVE METABOLIC PANEL
ALT: 14 U/L (ref 0–44)
AST: 17 U/L (ref 15–41)
Albumin: 3.8 g/dL (ref 3.5–5.0)
Alkaline Phosphatase: 50 U/L (ref 38–126)
Anion gap: 7 (ref 5–15)
BUN: 11 mg/dL (ref 6–20)
CO2: 22 mmol/L (ref 22–32)
Calcium: 8.8 mg/dL — ABNORMAL LOW (ref 8.9–10.3)
Chloride: 109 mmol/L (ref 98–111)
Creatinine, Ser: 0.66 mg/dL (ref 0.44–1.00)
GFR, Estimated: >60 mL/min (ref >60)
Glucose, Bld: 100 mg/dL — ABNORMAL HIGH (ref 70–99)
Potassium: 3.5 mmol/L (ref 3.5–5.1)
Sodium: 138 mmol/L (ref 135–145)
Total Bilirubin: 0.3 mg/dL (ref 0.3–1.2)
Total Protein: 6.3 g/dL — ABNORMAL LOW (ref 6.5–8.1)

## 2021-10-02 LAB — TROPONIN I (HIGH SENSITIVITY)
Troponin I (High Sensitivity): <2 ng/L (ref <18)
Troponin I (High Sensitivity): <2 ng/L (ref <18)

## 2021-10-02 LAB — LIPASE, BLOOD: Lipase: 32 U/L (ref 11–51)

## 2021-10-02 LAB — RESP PANEL BY RT-PCR (FLU A&B, COVID) ARPGX2
Influenza A by PCR: NEGATIVE
Influenza B by PCR: NEGATIVE
SARS Coronavirus 2 by RT PCR: NEGATIVE

## 2021-10-02 MED ORDER — SODIUM CHLORIDE 0.9 % IV BOLUS
1000.0000 mL | Freq: Once | INTRAVENOUS | Status: AC
  Administered 2021-10-02: 1000 mL via INTRAVENOUS

## 2021-10-02 NOTE — ED Triage Notes (Signed)
C/o Pt felt dizzy and shaky at work. Vomited twice. Chest pain after vomiting. Pt denies diaphoresis, diarrhea.

## 2021-10-02 NOTE — ED Notes (Signed)
X-ray at bedside

## 2021-10-02 NOTE — Discharge Instructions (Addendum)
You were evaluated in the Emergency Department and after careful evaluation, we did not find any emergent condition requiring admission or further testing in the hospital.  Your exam/testing today was overall reassuring.  Please return to the Emergency Department if you experience any worsening of your condition.  Thank you for allowing [place] to be a part of your care. Follow up with a family md in 2-3 weeks for recheck

## 2021-10-02 NOTE — ED Provider Notes (Signed)
[facility]-EM[facility]GENCY DEPT [facility] Emergency Department Provider Note MRN:  [id number]  Arrival date & time: 10/02/21     Chief Complaint   Dizziness   History of Present Illness   Elizaveta Mattice is a 36 y.o. year-old female with no pertinent past medical history presenting to the ED with chief complaint of dizziness.  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  Works as a Electrical engineer.  Dizziness described as a lightheadedness.  After the lightheadedness experienced nausea and vomiting x1.  Nonbloody nonbilious.  After the vomiting experienced pressure in the chest.  No abdominal pain.  No recent cough or cold-like symptoms, no recent fever.  No shortness of breath.  No leg pain or swelling.  No history of blood clots.  Did have some vomiting a few days ago as well.  Review of Systems  A thorough review of systems was obtained and all systems are negative except as noted in the HPI and PMH.   Patient's Health History    Past Medical History:  Diagnosis Date   Arthritis    Chronic back pain     Past Surgical History:  Procedure Laterality Date   TUBAL LIGATION      History reviewed. No pertinent family history.  Social History   Socioeconomic History   Marital status: Divorced    Spouse name: Not on file   Number of children: Not on file   Years of education: Not on file   Highest education level: Not on file  Occupational History   Not on file  Tobacco Use   Smoking status: Every Day    Packs/day: 1.00    Types: Cigarettes   Smokeless tobacco: Never  Vaping Use   Vaping Use: Never used  Substance and Sexual Activity   Alcohol use: Yes    Comment: socially   Drug use: Yes    Frequency: 2.0 times per week    Types: Marijuana   Sexual activity: Yes    Birth control/protection: None  Other Topics Concern   Not on file  Social History Narrative   Not on file   Social Determinants of Health   Financial Resource Strain: Not  on file  Food Insecurity: Not on file  Transportation Needs: Not on file  Physical Activity: Not on file  Stress: Not on file  Social Connections: Not on file  Intimate Partner Violence: Not on file     Physical Exam   Vitals:   10/02/21 0600 10/02/21 0630  BP: 104/69 116/71  Pulse: 85 74  Resp: 19 (!) 21  Temp:    SpO2: 98% 100%    CONSTITUTIONAL: Well-appearing, NAD NEURO/PSYCH:  Alert and oriented x 3, no focal deficits EYES:  eyes equal and reactive ENT/NECK:  no LAD, no JVD CARDIO: Regular rate, well-perfused, normal S1 and S2 PULM:  CTAB no wheezing or rhonchi GI/GU:  non-distended, non-tender MSK/SPINE:  No gross deformities, no edema SKIN:  no rash, atraumatic   *Additional and/or pertinent findings included in MDM below  Diagnostic and Interventional Summary    EKG Interpretation  Date/Time:  Wednesday October 02 2021 05:25:03 EST Ventricular Rate:  80 PR Interval:  132 QRS Duration: 93 QT Interval:  386 QTC Calculation: 446 R Axis:   10 Text Interpretation: Sinus rhythm No significant change was found Confirmed by Kennis Carina [phone]) on 10/02/2021 6:11:25 AM       Labs Reviewed  CBC - Abnormal; Notable for the following components:  Result Value   Platelets 113 (*)    All other components within normal limits  COMPREHENSIVE METABOLIC PANEL - Abnormal; Notable for the following components:   Glucose, Bld 100 (*)    Calcium 8.8 (*)    Total Protein 6.3 (*)    All other components within normal limits  RESP PANEL BY RT-PCR (FLU A&B, COVID) ARPGX2  LIPASE, BLOOD  TROPONIN I (HIGH SENSITIVITY)  TROPONIN I (HIGH SENSITIVITY)    DG Chest Port 1 View  Final Result      Medications  sodium chloride 0.9 % bolus 1,000 mL (1,000 mLs Intravenous New Bag/Given 10/02/21 0600)     Procedures  /  Critical Care Procedures  ED Course and Medical Decision Making  Initial Impression and Ddx Differential diagnosis includes gastritis, viral illness,  vagal episode, less likely ACS, very low concern for dissection or PE at this time.  Providing fluids, awaiting labs and chest x-ray and will reassess.  Past medical/surgical history that increases complexity of ED encounter: None  Interpretation of Diagnostics I personally reviewed the EKG and my interpretation is as follows: Sinus rhythm, no significant change from prior    First troponin is negative, labs overall within normal limits  Patient Reassessment and Ultimate Disposition/Management Reassessment patient is still feeling fairly well, in no acute distress, vital signs remain normal.  Will obtain second troponin, signed out to oncoming provider at shift change.  Patient management required discussion with the following services or consulting groups:  None  Complexity of Problems Addressed Acute complicated illness or Injury  Additional Data Reviewed and Analyzed Further history obtained from: Past medical history and medications listed in the EMR and Prior ED visit notes  Patient Encounter Risk Assessment Low  Elmer Sow. Pilar Plate, MD [facility] Emergency Medicine [facility] [email or web address]  Final Clinical Impressions(s) / ED Diagnoses     ICD-10-CM   1. Dizziness  R42     2. Chest pain, unspecified type  R07.9       ED Discharge Orders     None        Discharge Instructions Discussed with and Provided to Patient:       Sabas Sous, MD 10/02/21 [phone]

## 2022-07-08 ENCOUNTER — Ambulatory Visit: Payer: BC Managed Care – PPO | Admitting: Orthopaedic Surgery

## 2022-08-04 ENCOUNTER — Emergency Department (HOSPITAL_COMMUNITY)
Admission: EM | Admit: 2022-08-04 | Discharge: 2022-08-04 | Discharge status: Against Medical Advice | Payer: BC Managed Care – PPO

## 2022-08-19 ENCOUNTER — Ambulatory Visit: Payer: BC Managed Care – PPO | Admitting: Orthopaedic Surgery

## 2022-09-19 ENCOUNTER — Other Ambulatory Visit: Payer: Self-pay | Admitting: *Deleted

## 2022-09-19 DIAGNOSIS — K8020 Calculus of gallbladder without cholecystitis without obstruction: Secondary | ICD-10-CM

## 2022-09-25 ENCOUNTER — Ambulatory Visit: Payer: BC Managed Care – PPO | Admitting: Surgery

## 2022-09-25 ENCOUNTER — Encounter: Payer: Self-pay | Admitting: Surgery

## 2022-09-25 VITALS — BP 113/74 | HR 69 | Temp 98.4°F | Resp 12 | Ht 64.0 in | Wt 167.0 lb

## 2022-09-25 DIAGNOSIS — K8020 Calculus of gallbladder without cholecystitis without obstruction: Secondary | ICD-10-CM

## 2022-09-25 NOTE — Progress Notes (Signed)
[facility] History and Physical  Reason for Referral: Gallstones Referring Physician: Dr. Sherrie Sport  Chief Complaint   New Patient (Initial Visit)     Lauren Castaneda is a 37 y.o. female.  HPI: Patient presents for evaluation of gallstones.  She complains of episodes of right upper quadrant abdominal pain with the first episode being 2 months ago.  Her most recent episode was on 09/11/2022 at which time she confirmed nausea with vomiting.  These episodes are brought on by eating.  She saw her primary care doctor, who obtained an abdominal ultrasound which demonstrated cholelithiasis.  Her past medical history significant for IBS and asthma.  Her surgical history significant for a laparoscopic tubal ligation.  She denies use of blood thinning medications.  She smokes 1 pack of cigarettes per day, socially drinks alcohol, and uses daily marijuana.  Past Medical History:  Diagnosis Date   Arthritis    Chronic back pain     Past Surgical History:  Procedure Laterality Date   TUBAL LIGATION      No family history on file.  Social History   Tobacco Use   Smoking status: Every Day    Packs/day: 1.00    Types: Cigarettes   Smokeless tobacco: Never  Vaping Use   Vaping Use: Never used  Substance Use Topics   Alcohol use: Yes    Comment: socially   Drug use: Yes    Frequency: 2.0 times per week    Types: Marijuana    Medications: I have reviewed the patient's current medications. Allergies as of 09/25/2022       Reactions   Phenergan [promethazine Hcl]    Drops bp drastically         Medication List        Accurate as of September 25, 2022  3:21 PM. If you have any questions, ask your nurse or doctor.          STOP taking these medications    benzonatate 100 MG capsule Commonly known as: TESSALON Stopped by: Mliss Wedin A Feras Gardella, DO   diclofenac 50 MG EC tablet Commonly known as: VOLTAREN Stopped by: Clementine Soulliere A Jamaul Heist, DO    erythromycin ophthalmic ointment Stopped by: Laurajean Hosek A Cynda Soule, DO   methocarbamol 500 MG tablet Commonly known as: ROBAXIN Stopped by: Hector Taft A Kyle Luppino, DO   multivitamin-prenatal 27-0.8 MG Tabs tablet Stopped by: Wilmore Holsomback A Bessye Stith, DO   naproxen 500 MG tablet Commonly known as: NAPROSYN Stopped by: Annisa Mazzarella A Tiffani Kadow, DO       TAKE these medications    busPIRone 10 MG tablet Commonly known as: BUSPAR Take 10 mg by mouth 2 (two) times daily.   famotidine 20 MG tablet Commonly known as: PEPCID Take 20 mg by mouth 2 (two) times daily.   lansoprazole 30 MG capsule Commonly known as: PREVACID Take 30 mg by mouth daily as needed.   ondansetron 4 MG disintegrating tablet Commonly known as: Zofran ODT Take 1 tablet (4 mg total) by mouth every 8 (eight) hours as needed for nausea.   rizatriptan 10 MG tablet Commonly known as: MAXALT Take 10 mg by mouth as needed.   senna 8.6 MG Tabs tablet Commonly known as: SENOKOT Take 1 tablet by mouth 2 (two) times daily as needed.   vitamin B-12 500 MCG tablet Commonly known as: CYANOCOBALAMIN Take 500 mcg by mouth daily.         ROS:  Constitutional: positive for chills, negative for fatigue and fevers Eyes: negative for  visual disturbance and pain Ears, nose, mouth, throat, and face: positive for sinus problems Respiratory: positive for wheezing and shortness of breath, negative for wheezing Cardiovascular: negative for chest pain and palpitations Gastrointestinal: positive for abdominal pain, nausea, and reflux symptoms Genitourinary:negative for dysuria and frequency Integument/breast: negative for dryness and rash Hematologic/lymphatic: negative for bleeding and lymphadenopathy Musculoskeletal:positive for back pain and neck pain Neurological: positive for dizziness, negative for tremors Endocrine: negative for temperature intolerance  Blood pressure 113/74, pulse 69, temperature 98.4 F (36.9  C), temperature source Oral, resp. rate 12, height 5\' 4"  (1.626 m), weight 167 lb (75.8 kg), SpO2 97 %. Physical Exam Vitals reviewed.  Constitutional:      Appearance: Normal appearance.  HENT:     Head: Normocephalic and atraumatic.  Eyes:     Extraocular Movements: Extraocular movements intact.     Pupils: Pupils are equal, round, and reactive to light.  Cardiovascular:     Rate and Rhythm: Normal rate and regular rhythm.  Pulmonary:     Effort: Pulmonary effort is normal.     Breath sounds: Normal breath sounds.  Abdominal:     Comments: Abdomen soft, nondistended, no percussion tenderness, mild TTP in RUQ; no rigidity, guarding, or rebound tenderness; negative Murphy's sign  Musculoskeletal:        General: Normal range of motion.     Cervical back: Normal range of motion.  Skin:    General: Skin is warm and dry.  Neurological:     Mental Status: She is alert and oriented to person, place, and time.  Psychiatric:        Mood and Affect: Mood normal.        Behavior: Behavior normal.     Results: Abdominal ultrasound (09/09/2022): Impression: 1.  Cholelithiasis without evidence of acute cholecystitis   Assessment & Plan:  Lauren Castaneda is a 37 y.o. female who presents for evaluation of cholelithiasis.  -I explained the pathophysiology of gallbladder disease, and why we recommend surgical removal of the gallbladder. -I counseled the patient about the indication, risks and benefits of robotic assisted laparoscopic cholecystectomy.  She understands there is a very small chance for bleeding, infection, injury to normal structures (including common bile duct), conversion to open surgery, persistent symptoms, evolution of postcholecystectomy diarrhea, need for secondary interventions, anesthesia reaction, cardiopulmonary issues and other risks not specifically detailed here. I described the expected recovery, the plan for follow-up and the restrictions during the recovery  phase.  All questions were answered. -Patient tentatively scheduled for surgery on 1/24 -Information provided to the patient regarding low-fat diet, cholelithiasis, and cholecystectomy  All questions were answered to the satisfaction of the patient.  Graciella Freer, DO [facility] [address] [place], [place] [address] [phone] (office)

## 2022-09-29 NOTE — H&P (Signed)
[facility] History and Physical  Reason for Referral: Gallstones Referring Physician: Dr. Olena Leatherwood  Chief Complaint   New Patient (Initial Visit)     Lauren Castaneda is a 37 y.o. female.  HPI: Patient presents for evaluation of gallstones.  She complains of episodes of right upper quadrant abdominal pain with the first episode being 2 months ago.  Her most recent episode was on 09/11/2022 at which time she confirmed nausea with vomiting.  These episodes are brought on by eating.  She saw her primary care doctor, who obtained an abdominal ultrasound which demonstrated cholelithiasis.  Her past medical history significant for IBS and asthma.  Her surgical history significant for a laparoscopic tubal ligation.  She denies use of blood thinning medications.  She smokes 1 pack of cigarettes per day, socially drinks alcohol, and uses daily marijuana.  Past Medical History:  Diagnosis Date   Arthritis    Chronic back pain     Past Surgical History:  Procedure Laterality Date   TUBAL LIGATION      No family history on file.  Social History   Tobacco Use   Smoking status: Every Day    Packs/day: 1.00    Types: Cigarettes   Smokeless tobacco: Never  Vaping Use   Vaping Use: Never used  Substance Use Topics   Alcohol use: Yes    Comment: socially   Drug use: Yes    Frequency: 2.0 times per week    Types: Marijuana    Medications: I have reviewed the patient's current medications. Allergies as of 09/25/2022       Reactions   Phenergan [promethazine Hcl]    Drops bp drastically         Medication List        Accurate as of September 25, 2022  3:21 PM. If you have any questions, ask your nurse or doctor.          STOP taking these medications    benzonatate 100 MG capsule Commonly known as: TESSALON Stopped by: Kooper Chriswell A Abraham Entwistle, DO   diclofenac 50 MG EC tablet Commonly known as: VOLTAREN Stopped by: Deborahann Poteat A Jennefer Kopp, DO    erythromycin ophthalmic ointment Stopped by: Nainoa Woldt A Aariv Medlock, DO   methocarbamol 500 MG tablet Commonly known as: ROBAXIN Stopped by: Neah Sporrer A Madeline Pho, DO   multivitamin-prenatal 27-0.8 MG Tabs tablet Stopped by: Terissa Haffey A Taylan Mayhan, DO   naproxen 500 MG tablet Commonly known as: NAPROSYN Stopped by: Albertus Chiarelli A Adreanne Yono, DO       TAKE these medications    busPIRone 10 MG tablet Commonly known as: BUSPAR Take 10 mg by mouth 2 (two) times daily.   famotidine 20 MG tablet Commonly known as: PEPCID Take 20 mg by mouth 2 (two) times daily.   lansoprazole 30 MG capsule Commonly known as: PREVACID Take 30 mg by mouth daily as needed.   ondansetron 4 MG disintegrating tablet Commonly known as: Zofran ODT Take 1 tablet (4 mg total) by mouth every 8 (eight) hours as needed for nausea.   rizatriptan 10 MG tablet Commonly known as: MAXALT Take 10 mg by mouth as needed.   senna 8.6 MG Tabs tablet Commonly known as: SENOKOT Take 1 tablet by mouth 2 (two) times daily as needed.   vitamin B-12 500 MCG tablet Commonly known as: CYANOCOBALAMIN Take 500 mcg by mouth daily.         ROS:  Constitutional: positive for chills, negative for fatigue and fevers Eyes: negative for  visual disturbance and pain Ears, nose, mouth, throat, and face: positive for sinus problems Respiratory: positive for wheezing and shortness of breath, negative for wheezing Cardiovascular: negative for chest pain and palpitations Gastrointestinal: positive for abdominal pain, nausea, and reflux symptoms Genitourinary:negative for dysuria and frequency Integument/breast: negative for dryness and rash Hematologic/lymphatic: negative for bleeding and lymphadenopathy Musculoskeletal:positive for back pain and neck pain Neurological: positive for dizziness, negative for tremors Endocrine: negative for temperature intolerance  Blood pressure 113/74, pulse 69, temperature 98.4 F (36.9  C), temperature source Oral, resp. rate 12, height 5' 4" (1.626 m), weight 167 lb (75.8 kg), SpO2 97 %. Physical Exam Vitals reviewed.  Constitutional:      Appearance: Normal appearance.  HENT:     Head: Normocephalic and atraumatic.  Eyes:     Extraocular Movements: Extraocular movements intact.     Pupils: Pupils are equal, round, and reactive to light.  Cardiovascular:     Rate and Rhythm: Normal rate and regular rhythm.  Pulmonary:     Effort: Pulmonary effort is normal.     Breath sounds: Normal breath sounds.  Abdominal:     Comments: Abdomen soft, nondistended, no percussion tenderness, mild TTP in RUQ; no rigidity, guarding, or rebound tenderness; negative Murphy's sign  Musculoskeletal:        General: Normal range of motion.     Cervical back: Normal range of motion.  Skin:    General: Skin is warm and dry.  Neurological:     Mental Status: She is alert and oriented to person, place, and time.  Psychiatric:        Mood and Affect: Mood normal.        Behavior: Behavior normal.     Results: Abdominal ultrasound (09/09/2022): Impression: 1.  Cholelithiasis without evidence of acute cholecystitis   Assessment & Plan:  Lauren Castaneda is a 36 y.o. female who presents for evaluation of cholelithiasis.  -I explained the pathophysiology of gallbladder disease, and why we recommend surgical removal of the gallbladder. -I counseled the patient about the indication, risks and benefits of robotic assisted laparoscopic cholecystectomy.  She understands there is a very small chance for bleeding, infection, injury to normal structures (including common bile duct), conversion to open surgery, persistent symptoms, evolution of postcholecystectomy diarrhea, need for secondary interventions, anesthesia reaction, cardiopulmonary issues and other risks not specifically detailed here. I described the expected recovery, the plan for follow-up and the restrictions during the recovery  phase.  All questions were answered. -Patient tentatively scheduled for surgery on 1/24 -Information provided to the patient regarding low-fat diet, cholelithiasis, and cholecystectomy  All questions were answered to the satisfaction of the patient.  Barak Bialecki, DO [facility] [address] ,  [address] [phone] (office)      

## 2022-10-09 NOTE — Patient Instructions (Addendum)
Lauren Castaneda  10/09/2022     @PREFPERIOPPHARMACY @   Your procedure is scheduled on  10/15/2022.   Report to [facility] at  0900  A.M.   Call this number if you have problems the morning of surgery:  [phone]  If you experience any cold or flu symptoms such as cough, fever, chills, shortness of breath, etc. between now and your scheduled surgery, please notify [phone] at the above number.   Remember:  Do not eat or drink after midnight.      Take these medicines the morning of surgery with A SIP OF WATER       buspar, pepcid, prevacid, zofran (if needed), maxalt(if needed).     Do not wear jewelry, make-up or nail polish.  Do not wear lotions, powders, or perfumes, or deodorant.  Do not shave 48 hours prior to surgery.  Men may shave face and neck.  Do not bring valuables to the hospital.  [facility] is not responsible for any belongings or valuables.  Contacts, dentures or bridgework may not be worn into surgery.  Leave your suitcase in the car.  After surgery it may be brought to your room.  For patients admitted to the hospital, discharge time will be determined by your treatment team.  Patients discharged the day of surgery will not be allowed to drive home and must have someone with them for 24 hours.    Special instructions:   DO NOT smoke tobacco or vape for 24 hours before your procedure.  Please read over the following fact sheets that you were given. Pain Booklet, Coughing and Deep Breathing, Surgical Site Infection Prevention, Anesthesia Post-op Instructions, and Care and Recovery After Surgery       Minimally Invasive Cholecystectomy, Care After The following information offers guidance on how to care for yourself after your procedure. Your health care provider may also give you more specific instructions. If you have problems or questions, contact your health care provider. What can I expect after the procedure? After the procedure, it is  common to have: Pain at your incision sites. You will be given medicines to control this pain. Mild nausea or vomiting. Bloating and possible shoulder pain from the gas that was used during the procedure. Follow these instructions at home: Medicines Take over-the-counter and prescription medicines only as told by your health care provider. If you were prescribed an antibiotic medicine, take it as told by your health care provider. Do not stop using the antibiotic even if you start to feel better. Ask your health care provider if the medicine prescribed to you: Requires you to avoid driving or using machinery. Can cause constipation. You may need to take these actions to prevent or treat constipation: Drink enough fluid to keep your urine pale yellow. Take over-the-counter or prescription medicines. Eat foods that are high in fiber, such as beans, whole grains, and fresh fruits and vegetables. Limit foods that are high in fat and processed sugars, such as fried or sweet foods. Incision care  Follow instructions from your health care provider about how to take care of your incisions. Make sure you: Wash your hands with soap and water for at least 20 seconds before and after you change your bandage (dressing). If soap and water are not available, use hand sanitizer. Change your dressing as told by your health care provider. Leave stitches (sutures), skin glue, or adhesive strips in place. These skin closures may need to  be in place for 2 weeks or longer. If adhesive strip edges start to loosen and curl up, you may trim the loose edges. Do not remove adhesive strips completely unless your health care provider tells you to do that. Do not take baths, swim, or use a hot tub until your health care provider approves. Ask your health care provider if you may take showers. You may only be allowed to take sponge baths. Check your incision area every day for signs of infection. Check for: More redness,  swelling, or pain. Fluid or blood. Warmth. Pus or a bad smell. Activity Rest as told by your health care provider. Do not do activities that require a lot of effort. Avoid sitting for a long time without moving. Get up to take short walks every 1-2 hours. This is important to improve blood flow and breathing. Ask for help if you feel weak or unsteady. Do not lift anything that is heavier than 10 lb (4.5 kg), or the limit that you are told, until your health care provider says that it is safe. Do not play contact sports until your health care provider approves. Do not return to work or school until your health care provider approves. Return to your normal activities as told by your health care provider. Ask your health care provider what activities are safe for you. General instructions If you were given a sedative during the procedure, it can affect you for several hours. Do not drive or operate machinery until your health care provider says that it is safe. Keep all follow-up visits. This is important. Contact a health care provider if: You develop a rash. You have more redness, swelling, or pain around your incisions. You have fluid or blood coming from your incisions. Your incisions feel warm to the touch. You have pus or a bad smell coming from your incisions. You have a fever. One or more of your incisions breaks open. Get help right away if: You have trouble breathing. You have chest pain. You have more pain in your shoulders. You faint or feel dizzy when you stand. You have severe pain in your abdomen. You have nausea or vomiting that lasts for more than one day. You have leg pain that is new or unusual, or if it is localized to one specific spot. These symptoms may represent a serious problem that is an emergency. Do not wait to see if the symptoms will go away. Get medical help right away. Call your local emergency services (911 in the [place] Do not drive yourself to the  hospital. Summary After your procedure, it is common to have pain at the incision sites. You may also have nausea or bloating. Follow your health care provider's instructions about medicine, activity restrictions, and caring for your incision areas. Do not do activities that require a lot of effort. Contact a health care provider if you have a fever or other signs of infection, such as more redness, swelling, or pain around the incisions. Get help right away if you have chest pain, increasing pain in the shoulders, or trouble breathing. This information is not intended to replace advice given to you by your health care provider. Make sure you discuss any questions you have with your health care provider. Document Revised: 03/12/2021 Document Reviewed: 03/12/2021 Elsevier Patient Education  [place] Anesthesia, Adult, Care After The following information offers guidance on how to care for yourself after your procedure. Your health care provider may also give you more  specific instructions. If you have problems or questions, contact your health care provider. What can I expect after the procedure? After the procedure, it is common for people to: Have pain or discomfort at the IV site. Have nausea or vomiting. Have a sore throat or hoarseness. Have trouble concentrating. Feel cold or chills. Feel weak, sleepy, or tired (fatigue). Have soreness and body aches. These can affect parts of the body that were not involved in surgery. Follow these instructions at home: For the time period you were told by your health care provider:  Rest. Do not participate in activities where you could fall or become injured. Do not drive or use machinery. Do not drink alcohol. Do not take sleeping pills or medicines that cause drowsiness. Do not make important decisions or sign legal documents. Do not take care of children on your own. General instructions Drink enough fluid to keep your  urine pale yellow. If you have sleep apnea, surgery and certain medicines can increase your risk for breathing problems. Follow instructions from your health care provider about wearing your sleep device: Anytime you are sleeping, including during daytime naps. While taking prescription pain medicines, sleeping medicines, or medicines that make you drowsy. Return to your normal activities as told by your health care provider. Ask your health care provider what activities are safe for you. Take over-the-counter and prescription medicines only as told by your health care provider. Do not use any products that contain nicotine or tobacco. These products include cigarettes, chewing tobacco, and vaping devices, such as e-cigarettes. These can delay incision healing after surgery. If you need help quitting, ask your health care provider. Contact a health care provider if: You have nausea or vomiting that does not get better with medicine. You vomit every time you eat or drink. You have pain that does not get better with medicine. You cannot urinate or have bloody urine. You develop a skin rash. You have a fever. Get help right away if: You have trouble breathing. You have chest pain. You vomit blood. These symptoms may be an emergency. Get help right away. Call 911. Do not wait to see if the symptoms will go away. Do not drive yourself to the hospital. Summary After the procedure, it is common to have a sore throat, hoarseness, nausea, vomiting, or to feel weak, sleepy, or fatigue. For the time period you were told by your health care provider, do not drive or use machinery. Get help right away if you have difficulty breathing, have chest pain, or vomit blood. These symptoms may be an emergency. This information is not intended to replace advice given to you by your health care provider. Make sure you discuss any questions you have with your health care provider. Document Revised: 12/06/2021  Document Reviewed: 12/06/2021 Elsevier Patient Education  2023 Elsevier Inc. How to Use Chlorhexidine Before Surgery Chlorhexidine gluconate (CHG) is a germ-killing (antiseptic) solution that is used to clean the skin. It can get rid of the bacteria that normally live on the skin and can keep them away for about 24 hours. To clean your skin with CHG, you may be given: A CHG solution to use in the shower or as part of a sponge bath. A prepackaged cloth that contains CHG. Cleaning your skin with CHG may help lower the risk for infection: While you are staying in the intensive care unit of the hospital. If you have a vascular access, such as a central line, to provide short-term or long-term access  to your veins. If you have a catheter to drain urine from your bladder. If you are on a ventilator. A ventilator is a machine that helps you breathe by moving air in and out of your lungs. After surgery. What are the risks? Risks of using CHG include: A skin reaction. Hearing loss, if CHG gets in your ears and you have a perforated eardrum. Eye injury, if CHG gets in your eyes and is not rinsed out. The CHG product catching fire. Make sure that you avoid smoking and flames after applying CHG to your skin. Do not use CHG: If you have a chlorhexidine allergy or have previously reacted to chlorhexidine. On babies younger than 10 months of age. How to use CHG solution Use CHG only as told by your health care provider, and follow the instructions on the label. Use the full amount of CHG as directed. Usually, this is one bottle. During a shower Follow these steps when using CHG solution during a shower (unless your health care provider gives you different instructions): Start the shower. Use your normal soap and shampoo to wash your face and hair. Turn off the shower or move out of the shower stream. Pour the CHG onto a clean washcloth. Do not use any type of brush or rough-edged sponge. Starting at  your neck, lather your body down to your toes. Make sure you follow these instructions: If you will be having surgery, pay special attention to the part of your body where you will be having surgery. Scrub this area for at least 1 minute. Do not use CHG on your head or face. If the solution gets into your ears or eyes, rinse them well with water. Avoid your genital area. Avoid any areas of skin that have broken skin, cuts, or scrapes. Scrub your back and under your arms. Make sure to wash skin folds. Let the lather sit on your skin for 1-2 minutes or as long as told by your health care provider. Thoroughly rinse your entire body in the shower. Make sure that all body creases and crevices are rinsed well. Dry off with a clean towel. Do not put any substances on your body afterward--such as powder, lotion, or perfume--unless you are told to do so by your health care provider. Only use lotions that are recommended by the manufacturer. Put on clean clothes or pajamas. If it is the night before your surgery, sleep in clean sheets.  During a sponge bath Follow these steps when using CHG solution during a sponge bath (unless your health care provider gives you different instructions): Use your normal soap and shampoo to wash your face and hair. Pour the CHG onto a clean washcloth. Starting at your neck, lather your body down to your toes. Make sure you follow these instructions: If you will be having surgery, pay special attention to the part of your body where you will be having surgery. Scrub this area for at least 1 minute. Do not use CHG on your head or face. If the solution gets into your ears or eyes, rinse them well with water. Avoid your genital area. Avoid any areas of skin that have broken skin, cuts, or scrapes. Scrub your back and under your arms. Make sure to wash skin folds. Let the lather sit on your skin for 1-2 minutes or as long as told by your health care provider. Using a  different clean, wet washcloth, thoroughly rinse your entire body. Make sure that all body creases and crevices  are rinsed well. Dry off with a clean towel. Do not put any substances on your body afterward--such as powder, lotion, or perfume--unless you are told to do so by your health care provider. Only use lotions that are recommended by the manufacturer. Put on clean clothes or pajamas. If it is the night before your surgery, sleep in clean sheets. How to use CHG prepackaged cloths Only use CHG cloths as told by your health care provider, and follow the instructions on the label. Use the CHG cloth on clean, dry skin. Do not use the CHG cloth on your head or face unless your health care provider tells you to. When washing with the CHG cloth: Avoid your genital area. Avoid any areas of skin that have broken skin, cuts, or scrapes. Before surgery Follow these steps when using a CHG cloth to clean before surgery (unless your health care provider gives you different instructions): Using the CHG cloth, vigorously scrub the part of your body where you will be having surgery. Scrub using a back-and-forth motion for 3 minutes. The area on your body should be completely wet with CHG when you are done scrubbing. Do not rinse. Discard the cloth and let the area air-dry. Do not put any substances on the area afterward, such as powder, lotion, or perfume. Put on clean clothes or pajamas. If it is the night before your surgery, sleep in clean sheets.  For general bathing Follow these steps when using CHG cloths for general bathing (unless your health care provider gives you different instructions). Use a separate CHG cloth for each area of your body. Make sure you wash between any folds of skin and between your fingers and toes. Wash your body in the following order, switching to a new cloth after each step: The front of your neck, shoulders, and chest. Both of your arms, under your arms, and your  hands. Your stomach and groin area, avoiding the genitals. Your right leg and foot. Your left leg and foot. The back of your neck, your back, and your buttocks. Do not rinse. Discard the cloth and let the area air-dry. Do not put any substances on your body afterward--such as powder, lotion, or perfume--unless you are told to do so by your health care provider. Only use lotions that are recommended by the manufacturer. Put on clean clothes or pajamas. Contact a health care provider if: Your skin gets irritated after scrubbing. You have questions about using your solution or cloth. You swallow any chlorhexidine. Call your local poison control center [phone] in the [place] Get help right away if: Your eyes itch badly, or they become very red or swollen. Your skin itches badly and is red or swollen. Your hearing changes. You have trouble seeing. You have swelling or tingling in your mouth or throat. You have trouble breathing. These symptoms may represent a serious problem that is an emergency. Do not wait to see if the symptoms will go away. Get medical help right away. Call your local emergency services (911 in the [place] Do not drive yourself to the hospital. Summary Chlorhexidine gluconate (CHG) is a germ-killing (antiseptic) solution that is used to clean the skin. Cleaning your skin with CHG may help to lower your risk for infection. You may be given CHG to use for bathing. It may be in a bottle or in a prepackaged cloth to use on your skin. Carefully follow your health care provider's instructions and the instructions on the product label. Do not use CHG  if you have a chlorhexidine allergy. Contact your health care provider if your skin gets irritated after scrubbing. This information is not intended to replace advice given to you by your health care provider. Make sure you discuss any questions you have with your health care provider. Document Revised: 01/06/2022 Document  Reviewed: 11/19/2020 Elsevier Patient Education  2023 [employer]

## 2022-10-13 ENCOUNTER — Encounter (HOSPITAL_COMMUNITY)
Admission: RE | Admit: 2022-10-13 | Discharge: 2022-10-13 | Disposition: A | Discharge status: Home / Self Care | Payer: BC Managed Care – PPO | Source: Ambulatory Visit | Attending: Surgery | Admitting: Surgery

## 2022-10-13 ENCOUNTER — Encounter (HOSPITAL_COMMUNITY): Payer: Self-pay

## 2022-10-13 VITALS — BP 109/78 | HR 66 | Temp 98.5°F | Resp 18 | Ht 64.0 in | Wt 167.0 lb

## 2022-10-13 DIAGNOSIS — F17200 Nicotine dependence, unspecified, uncomplicated: Secondary | ICD-10-CM

## 2022-10-13 DIAGNOSIS — J45909 Unspecified asthma, uncomplicated: Secondary | ICD-10-CM | POA: Diagnosis not present

## 2022-10-13 DIAGNOSIS — F419 Anxiety disorder, unspecified: Secondary | ICD-10-CM | POA: Diagnosis not present

## 2022-10-13 DIAGNOSIS — K219 Gastro-esophageal reflux disease without esophagitis: Secondary | ICD-10-CM | POA: Diagnosis not present

## 2022-10-13 DIAGNOSIS — K8020 Calculus of gallbladder without cholecystitis without obstruction: Secondary | ICD-10-CM | POA: Diagnosis present

## 2022-10-13 DIAGNOSIS — Z01818 Encounter for other preprocedural examination: Secondary | ICD-10-CM

## 2022-10-13 DIAGNOSIS — F17210 Nicotine dependence, cigarettes, uncomplicated: Secondary | ICD-10-CM | POA: Diagnosis not present

## 2022-10-13 DIAGNOSIS — K811 Chronic cholecystitis: Secondary | ICD-10-CM | POA: Diagnosis not present

## 2022-10-13 HISTORY — DX: Unspecified asthma, uncomplicated: J45.909

## 2022-10-13 HISTORY — DX: Depression, unspecified: F32.A

## 2022-10-13 HISTORY — DX: Cardiac murmur, unspecified: R01.1

## 2022-10-13 HISTORY — DX: Gastro-esophageal reflux disease without esophagitis: K21.9

## 2022-10-13 HISTORY — DX: Anxiety disorder, unspecified: F41.9

## 2022-10-13 LAB — POCT PREGNANCY, URINE: Preg Test, Ur: NEGATIVE

## 2022-10-15 ENCOUNTER — Encounter (HOSPITAL_COMMUNITY)
Admission: RE | Disposition: A | Discharge status: Home / Self Care | Payer: Self-pay | Source: Ambulatory Visit | Attending: Surgery

## 2022-10-15 ENCOUNTER — Encounter (HOSPITAL_COMMUNITY): Payer: Self-pay | Admitting: Surgery

## 2022-10-15 ENCOUNTER — Other Ambulatory Visit: Payer: Self-pay

## 2022-10-15 ENCOUNTER — Ambulatory Visit (HOSPITAL_COMMUNITY)
Admission: RE | Admit: 2022-10-15 | Discharge: 2022-10-15 | Disposition: A | Discharge status: Home / Self Care | Payer: BC Managed Care – PPO | Source: Ambulatory Visit | Attending: Surgery | Admitting: Surgery

## 2022-10-15 ENCOUNTER — Ambulatory Visit (HOSPITAL_COMMUNITY): Payer: BC Managed Care – PPO | Admitting: Anesthesiology

## 2022-10-15 DIAGNOSIS — K8020 Calculus of gallbladder without cholecystitis without obstruction: Secondary | ICD-10-CM | POA: Diagnosis not present

## 2022-10-15 DIAGNOSIS — F419 Anxiety disorder, unspecified: Secondary | ICD-10-CM | POA: Insufficient documentation

## 2022-10-15 DIAGNOSIS — K811 Chronic cholecystitis: Secondary | ICD-10-CM | POA: Insufficient documentation

## 2022-10-15 DIAGNOSIS — J45909 Unspecified asthma, uncomplicated: Secondary | ICD-10-CM | POA: Insufficient documentation

## 2022-10-15 DIAGNOSIS — F17210 Nicotine dependence, cigarettes, uncomplicated: Secondary | ICD-10-CM | POA: Insufficient documentation

## 2022-10-15 DIAGNOSIS — K219 Gastro-esophageal reflux disease without esophagitis: Secondary | ICD-10-CM | POA: Insufficient documentation

## 2022-10-15 LAB — POCT PREGNANCY, URINE: Preg Test, Ur: NEGATIVE

## 2022-10-15 SURGERY — CHOLECYSTECTOMY, ROBOT-ASSISTED, LAPAROSCOPIC
Anesthesia: General | Site: Abdomen

## 2022-10-15 MED ORDER — DEXAMETHASONE SODIUM PHOSPHATE 10 MG/ML IJ SOLN
INTRAMUSCULAR | Status: AC
  Filled 2022-10-15: qty 1

## 2022-10-15 MED ORDER — INDOCYANINE GREEN 25 MG IV SOLR: 2.5000 mg | Freq: Once | INTRAVENOUS | Status: AC

## 2022-10-15 MED ORDER — PROPOFOL 10 MG/ML IV BOLUS
INTRAVENOUS | Status: AC
  Filled 2022-10-15: qty 40

## 2022-10-15 MED ORDER — ONDANSETRON HCL 4 MG/2ML IJ SOLN: 4.0000 mg | Freq: Once | INTRAMUSCULAR | Status: DC | PRN

## 2022-10-15 MED ORDER — PHENYLEPHRINE HCL (PRESSORS) 10 MG/ML IV SOLN
INTRAVENOUS | Status: DC | PRN
  Administered 2022-10-15: 80 ug via INTRAVENOUS

## 2022-10-15 MED ORDER — DEXAMETHASONE SODIUM PHOSPHATE 10 MG/ML IJ SOLN
INTRAMUSCULAR | Status: DC | PRN
  Administered 2022-10-15: 10 mg via INTRAVENOUS

## 2022-10-15 MED ORDER — STERILE WATER FOR IRRIGATION IR SOLN
Status: DC | PRN
  Administered 2022-10-15: 500 mL

## 2022-10-15 MED ORDER — ONDANSETRON HCL 4 MG/2ML IJ SOLN
INTRAMUSCULAR | Status: AC
  Filled 2022-10-15: qty 2

## 2022-10-15 MED ORDER — ONDANSETRON HCL 4 MG/2ML IJ SOLN
INTRAMUSCULAR | Status: DC | PRN
  Administered 2022-10-15: 4 mg via INTRAVENOUS

## 2022-10-15 MED ORDER — MEPERIDINE HCL 50 MG/ML IJ SOLN: 6.2500 mg | INTRAMUSCULAR | Status: DC | PRN

## 2022-10-15 MED ORDER — INDOCYANINE GREEN 25 MG IV SOLR
INTRAVENOUS | Status: AC
  Administered 2022-10-15: 2.5 mg via INTRAVENOUS
  Filled 2022-10-15: qty 10

## 2022-10-15 MED ORDER — LACTATED RINGERS IV SOLN: INTRAVENOUS | Status: DC

## 2022-10-15 MED ORDER — ORAL CARE MOUTH RINSE: 15.0000 mL | Freq: Once | OROMUCOSAL | Status: AC

## 2022-10-15 MED ORDER — FENTANYL CITRATE (PF) 100 MCG/2ML IJ SOLN
INTRAMUSCULAR | Status: DC | PRN
  Administered 2022-10-15: 25 ug via INTRAVENOUS
  Administered 2022-10-15: 50 ug via INTRAVENOUS
  Administered 2022-10-15: 25 ug via INTRAVENOUS
  Administered 2022-10-15 (×2): 50 ug via INTRAVENOUS

## 2022-10-15 MED ORDER — LIDOCAINE HCL (PF) 2 % IJ SOLN
INTRAMUSCULAR | Status: AC
  Filled 2022-10-15: qty 5

## 2022-10-15 MED ORDER — CHLORHEXIDINE GLUCONATE CLOTH 2 % EX PADS: 6.0000 | MEDICATED_PAD | Freq: Once | CUTANEOUS | Status: DC

## 2022-10-15 MED ORDER — OXYCODONE HCL 5 MG PO TABS: 5.0000 mg | ORAL_TABLET | Freq: Four times a day (QID) | ORAL | 0 refills | Status: AC | PRN

## 2022-10-15 MED ORDER — SCOPOLAMINE 1 MG/3DAYS TD PT72
1.0000 | MEDICATED_PATCH | Freq: Once | TRANSDERMAL | Status: AC
  Administered 2022-10-15: 1 via TRANSDERMAL
  Filled 2022-10-15: qty 1

## 2022-10-15 MED ORDER — BUPIVACAINE LIPOSOME 1.3 % IJ SUSP
INTRAMUSCULAR | Status: DC | PRN
  Administered 2022-10-15: 20 mL

## 2022-10-15 MED ORDER — KETOROLAC TROMETHAMINE 30 MG/ML IJ SOLN
INTRAMUSCULAR | Status: DC | PRN
  Administered 2022-10-15: 30 mg via INTRAVENOUS

## 2022-10-15 MED ORDER — PROPOFOL 10 MG/ML IV BOLUS
INTRAVENOUS | Status: DC | PRN
  Administered 2022-10-15: 160 mg via INTRAVENOUS

## 2022-10-15 MED ORDER — SODIUM CHLORIDE 0.9 % IV SOLN
2.0000 g | INTRAVENOUS | Status: AC
  Administered 2022-10-15: 2 g via INTRAVENOUS

## 2022-10-15 MED ORDER — MIDAZOLAM HCL 2 MG/2ML IJ SOLN
INTRAMUSCULAR | Status: AC
  Filled 2022-10-15: qty 2

## 2022-10-15 MED ORDER — ACETAMINOPHEN 500 MG PO TABS: 1000.0000 mg | ORAL_TABLET | Freq: Four times a day (QID) | ORAL | 0 refills | Status: AC

## 2022-10-15 MED ORDER — ROCURONIUM 10MG/ML (10ML) SYRINGE FOR MEDFUSION PUMP - OPTIME
INTRAVENOUS | Status: DC | PRN
  Administered 2022-10-15: 10 mg via INTRAVENOUS
  Administered 2022-10-15: 50 mg via INTRAVENOUS

## 2022-10-15 MED ORDER — CHLORHEXIDINE GLUCONATE 0.12 % MT SOLN
15.0000 mL | Freq: Once | OROMUCOSAL | Status: AC
  Administered 2022-10-15: 15 mL via OROMUCOSAL

## 2022-10-15 MED ORDER — LIDOCAINE HCL (CARDIAC) PF 50 MG/5ML IV SOSY
PREFILLED_SYRINGE | INTRAVENOUS | Status: DC | PRN
  Administered 2022-10-15: 80 mg via INTRAVENOUS

## 2022-10-15 MED ORDER — SODIUM CHLORIDE 0.9 % IV SOLN
INTRAVENOUS | Status: AC
  Filled 2022-10-15: qty 2

## 2022-10-15 MED ORDER — HYDROMORPHONE HCL 1 MG/ML IJ SOLN
0.2500 mg | INTRAMUSCULAR | Status: DC | PRN
  Filled 2022-10-15 (×2): qty 0.5

## 2022-10-15 MED ORDER — FENTANYL CITRATE (PF) 100 MCG/2ML IJ SOLN
INTRAMUSCULAR | Status: AC
  Filled 2022-10-15: qty 2

## 2022-10-15 MED ORDER — MIDAZOLAM HCL 5 MG/5ML IJ SOLN
INTRAMUSCULAR | Status: DC | PRN
  Administered 2022-10-15: 2 mg via INTRAVENOUS

## 2022-10-15 MED ORDER — CHLORHEXIDINE GLUCONATE CLOTH 2 % EX PADS
6.0000 | MEDICATED_PAD | Freq: Once | CUTANEOUS | Status: DC
Start: 2022-10-15 — End: 2022-10-15

## 2022-10-15 MED ORDER — SUGAMMADEX SODIUM 200 MG/2ML IV SOLN
INTRAVENOUS | Status: DC | PRN
  Administered 2022-10-15: 150 mg via INTRAVENOUS

## 2022-10-15 SURGICAL SUPPLY — 41 items
ADH SKN CLS APL DERMABOND .7 (GAUZE/BANDAGES/DRESSINGS) ×1
APL PRP STRL LF DISP 70% ISPRP (MISCELLANEOUS) ×1
BLADE SURG 15 STRL LF DISP TIS (BLADE) ×1 IMPLANT
BLADE SURG 15 STRL SS (BLADE) ×1
CHLORAPREP W/TINT 26 (MISCELLANEOUS) ×1 IMPLANT
CLIP LIGATING HEM O LOK PURPLE (MISCELLANEOUS) ×1 IMPLANT
COVER TIP SHEARS 8 DVNC (MISCELLANEOUS) ×1 IMPLANT
COVER TIP SHEARS 8MM DA VINCI (MISCELLANEOUS) ×1
DEFOGGER SCOPE WARMER CLEARIFY (MISCELLANEOUS) IMPLANT
DERMABOND ADVANCED .7 DNX12 (GAUZE/BANDAGES/DRESSINGS) ×1 IMPLANT
DRAPE ARM DVNC X/XI (DISPOSABLE) ×4 IMPLANT
DRAPE COLUMN DVNC XI (DISPOSABLE) ×1 IMPLANT
DRAPE DA VINCI XI ARM (DISPOSABLE) ×4
DRAPE DA VINCI XI COLUMN (DISPOSABLE) ×1
ELECT REM PT RETURN 9FT ADLT (ELECTROSURGICAL) ×1
ELECTRODE REM PT RTRN 9FT ADLT (ELECTROSURGICAL) ×1 IMPLANT
GLOVE BIOGEL PI IND STRL 6.5 (GLOVE) ×2 IMPLANT
GLOVE BIOGEL PI IND STRL 7.0 (GLOVE) ×3 IMPLANT
GLOVE SURG SS PI 6.5 STRL IVOR (GLOVE) ×2 IMPLANT
GOWN STRL REUS W/TWL LRG LVL3 (GOWN DISPOSABLE) ×3 IMPLANT
GRASPER SUT TROCAR 14GX15 (MISCELLANEOUS) IMPLANT
MANIFOLD NEPTUNE II (INSTRUMENTS) ×1 IMPLANT
NDL HYPO 21X1 ECLIPSE (NEEDLE) ×1 IMPLANT
NDL INSUFFLATION 14GA 120MM (NEEDLE) ×1 IMPLANT
NEEDLE HYPO 21X1 ECLIPSE (NEEDLE) ×1 IMPLANT
NEEDLE INSUFFLATION 14GA 120MM (NEEDLE) ×1 IMPLANT
OBTURATOR OPTICAL STANDARD 8MM (TROCAR) ×1
OBTURATOR OPTICAL STND 8 DVNC (TROCAR) ×1
OBTURATOR OPTICALSTD 8 DVNC (TROCAR) ×1 IMPLANT
PACK LAP CHOLE LZT030E (CUSTOM PROCEDURE TRAY) ×1 IMPLANT
PENCIL SMOKE EVACUATOR (MISCELLANEOUS) IMPLANT
SEAL CANN UNIV 5-8 DVNC XI (MISCELLANEOUS) ×3 IMPLANT
SEAL XI 5MM-8MM UNIVERSAL (MISCELLANEOUS) ×4
SET BASIN LINEN APH (SET/KITS/TRAYS/PACK) ×1 IMPLANT
SET TUBE SMOKE EVAC HIGH FLOW (TUBING) ×1 IMPLANT
SUT MNCRL AB 4-0 PS2 18 (SUTURE) ×2 IMPLANT
SUT VICRYL 0 AB UR-6 (SUTURE) IMPLANT
SYR 20ML LL LF (SYRINGE) ×1 IMPLANT
SYS RETRIEVAL 5MM INZII UNIV (BASKET) ×1
SYSTEM RETRIEVL 5MM INZII UNIV (BASKET) IMPLANT
WATER STERILE IRR 500ML POUR (IV SOLUTION) ×1 IMPLANT

## 2022-10-15 NOTE — Transfer of Care (Signed)
Immediate Anesthesia Transfer of Care Note  Patient: Lauren Castaneda  Procedure(s) Performed: XI ROBOTIC ASSISTED LAPAROSCOPIC CHOLECYSTECTOMY (Abdomen)  Patient Location: PACU  Anesthesia Type:General  Level of Consciousness: awake  Airway & Oxygen Therapy: Patient Spontanous Breathing  Post-op Assessment: Report given to RN  Post vital signs: Reviewed and stable  Last Vitals:  Vitals Value Taken Time  BP 122/88 10/15/22 1142  Temp 36.7 C 10/15/22 1142  Pulse    Resp 14 10/15/22 1142  SpO2 97 % 10/15/22 1142    Last Pain:  Vitals:   10/15/22 1146  PainSc: Asleep      Patients Stated Pain Goal: 9 (60/04/59 9774)  Complications: No notable events documented.

## 2022-10-15 NOTE — Discharge Instructions (Signed)
Ambulatory Surgery Discharge Instructions  General Anesthesia or Sedation Do not drive or operate heavy machinery for 24 hours.  Do not consume alcohol, tranquilizers, sleeping medications, or any non-prescribed medications for 24 hours. Do not make important decisions or sign any important papers in the next 24 hours. You should have someone with you tonight at home.  Activity  You are advised to go directly home from the hospital.  Restrict your activities and rest for a day.  Resume light activity tomorrow. No heavy lifting over 10 lbs or strenuous exercise.  Fluids and Diet Begin with clear liquids, bouillon, dry toast, soda crackers.  If not nauseated, you may go to a regular diet when you desire.  Greasy and spicy foods are not advised.  Medications  If you have not had a bowel movement in 24 hours, take 2 tablespoons over the counter Milk of mag.             You May resume your blood thinners tomorrow (Aspirin, coumadin, or other).  You are being discharged with prescriptions for Opioid/Narcotic Medications: There are some specific considerations for these medications that you should know. Opioid Meds have risks & benefits. Addiction to these meds is always a concern with prolonged use Take medication only as directed Do not drive while taking narcotic pain medication Do not crush tablets or capsules Do not use a different container than medication was dispensed in Lock the container of medication in a cool, dry place out of reach of children and pets. Opioid medication can cause addiction Do not share with anyone else (this is a felony) Do not store medications for future use. Dispose of them properly.     Disposal:  Find a [place] household drug take back site near you.  If you can't get to a drug take back site, use the recipe below as a last resort to dispose of expired, unused or unwanted drugs. Disposal  (Do not dispose chemotherapy drugs this way, talk to your  prescribing doctor instead.) Step 1: Mix drugs (do not crush) with dirt, kitty litter, or used coffee grounds and add a small amount of water to dissolve any solid medications. Step 2: Seal drugs in plastic bag. Step 3: Place plastic bag in trash. Step 4: Take prescription container and scratch out personal information, then recycle or throw away.  Operative Site  You have a liquid bandage over your incisions, this will begin to flake off in about a week. Ok to shower tomorrow. Keep wound clean and dry. No baths or swimming. No lifting more than 10 pounds.  Contact Information: If you have questions or concerns, please call our office, [phone], Monday- Thursday 8AM-5PM and Friday 8AM-12Noon.  If it is after hours or on the weekend, please call [facility] Main [facility], [phone], and ask to speak to the surgeon on call for Dr. Enola Siebers at [place].   SPECIFIC COMPLICATIONS TO WATCH FOR: Inability to urinate Fever over 101? F by mouth Nausea and vomiting lasting longer than 24 hours. Pain not relieved by medication ordered Swelling around the operative site Increased redness, warmth, hardness, around operative area Numbness, tingling, or cold fingers or toes Blood -soaked dressing, (small amounts of oozing may be normal) Increasing and progressive drainage from surgical area or exam site  

## 2022-10-15 NOTE — Progress Notes (Signed)
[facility]  Spoke with the patient's husband on the phone.  I explained that she tolerated the procedure without difficulty.  She has dissolvable stitches under the skin with overlying skin glue.  This will flake off in 10 to 14 days.  I discharged her home with a prescription for narcotic pain medication that they should take as needed for pain.  I also want her taking scheduled Tylenol.  If they take the narcotic pain medication, she should continue to take her senna.  The patient will follow-up with me in 2 weeks for phone follow-up.  All questions were answered to his expressed satisfaction.  Graciella Freer, DO [facility] [address] [place], [place] [address] [phone] (office)

## 2022-10-15 NOTE — Anesthesia Postprocedure Evaluation (Signed)
Anesthesia Post Note  Patient: Lauren Castaneda  Procedure(s) Performed: XI ROBOTIC ASSISTED LAPAROSCOPIC CHOLECYSTECTOMY (Abdomen)  Patient location during evaluation: PACU Anesthesia Type: General Level of consciousness: awake and alert Pain management: pain level controlled Vital Signs Assessment: post-procedure vital signs reviewed and stable Respiratory status: spontaneous breathing Cardiovascular status: blood pressure returned to baseline and stable Postop Assessment: no headache and no apparent nausea or vomiting Anesthetic complications: no   No notable events documented.   Last Vitals:  Vitals:   10/15/22 1230 10/15/22 1235  BP: 112/74 110/79  Pulse: 70 85  Resp: 19   Temp:  36.7 C  SpO2: 100% 100%    Last Pain:  Vitals:   10/15/22 1235  TempSrc: Oral  PainSc: 2                  Ivori Storr

## 2022-10-15 NOTE — Op Note (Signed)
[facility] Operative Note  Preoperative diagnosis:  cholelithiasis  Postoperative diagnosis: same as above  Procedure: Robotic assisted Laparoscopic Cholecystectomy.   Anesthesia: GETA   Surgeon: Graciella Freer, DO  Specimen: Gallbladder  Complications: None  EBL: 47mL  Wound Classification: Clean Contaminated  Indications: Patient is a 37 year old female who presents for cholecystectomy.  She has had multiple gallbladder attacks in the past and was noted to have gallstones on imaging.  She is agreeable to cholecystectomy.  All risks and benefits of performing this procedure were discussed with the patient including pain, infection, bleeding, damage to the surrounding structures, and need for more procedures or surgery. The patient voiced understanding of the procedure, all questions were sought and answered, and consent was obtained.  Findings: Critical view of safety noted Cystic duct and artery identified, ligated and divided, clips remained intact at end of procedure Adequate hemostasis  Description of procedure:  The patient was placed on the operating table in the supine position. SCDs placed, pre-op abx administered.  General anesthesia was induced and OG tube placed by anesthesia. A time-out was completed verifying correct patient, procedure, site, positioning, and implant(s) and/or special equipment prior to beginning this procedure. The abdomen was prepped and draped in the usual sterile fashion.    Veress needle was placed at the umbilical incision and insufflation was started after confirming a positive saline drop test and no immediate increase in abdominal pressure.  After reaching 15 mm, the Veress needle was removed and a 8 mm port was placed via optiview technique under umbilicus measured 64PP from gallbladder.  The abdomen was inspected and no abnormalities or injuries were found.  Under direct vision, ports were placed in the following  locations: One 8 mm patient left of the umbilicus, 8cm from the optiviewed port, one 8 mm port placed to the patient right of the umbilical port 8 cm apart.  1 additional 8 mm port placed far right lateral.  Once ports were placed, the table was placed in the reverse Trendelenburg position with the right side up. The Xi platform was brought into the operative field and docked to the ports successfully.  An endoscope was placed through the umbilical port, prograsp through the adjacent patient right port, fenestrated bipolar to the far patient right port, and then a hook cautery in the left port.  The dome of the gallbladder was grasped with prograsp, passed and retracted over the dome of the liver. Adhesions between the gallbladder and omentum, duodenum and transverse colon were lysed via hook cautery. The infundibulum was grasped with the fenestrated grasper and retracted toward the right lower quadrant. This maneuver exposed Calot's triangle. The peritoneum overlying the gallbladder infundibulum was then dissected  and the cystic duct and cystic artery identified.  Critical view of safety with the liver bed clearly visible behind the duct and artery with no additional structures noted.  The cystic duct and cystic artery clipped and divided close to the gallbladder.     The gallbladder was then dissected from its peritoneal and liver bed attachments by electrocautery. Hemostasis was checked prior to removing the hook cautery and the Endo Catch bag was then placed through the umbilical port and the gallbladder was removed.  The gallbladder was passed off the table as a specimen. There was no evidence of bleeding from the gallbladder fossa or cystic artery or leakage of the bile from the cystic duct stump. The umbilical port site closed with PMI using 0 vicryl under direct  vision.  Abdomen desufflated and secondary trocars were removed under direct vision. No bleeding was noted.  Incisions were localized with  Exparel.  All skin incisions then closed with subcuticular sutures of 4-0 monocryl and dressed with topical skin adhesive. The orogastric tube was removed and patient extubated.  The patient tolerated the procedure well and was taken to the postanesthesia care unit in stable condition.  All sponge and instrument count correct at end of procedure.  Graciella Freer, DO [facility] [address] [place], [place] [address] [phone] (office)

## 2022-10-15 NOTE — Interval H&P Note (Signed)
History and Physical Interval Note:  10/15/2022 9:45 AM  Lauren Castaneda  has presented today for surgery, with the diagnosis of Cholelithiasis.  The various methods of treatment have been discussed with the patient and family. After consideration of risks, benefits and other options for treatment, the patient has consented to  Procedure(s): XI ROBOTIC [place] (N/A) as a surgical intervention.  The patient's history has been reviewed, patient examined, no change in status, stable for surgery.  I have reviewed the patient's chart and labs.  Questions were answered to the patient's satisfaction.     [place]

## 2022-10-15 NOTE — Anesthesia Preprocedure Evaluation (Addendum)
Anesthesia Evaluation  Patient identified by MRN, date of birth, ID band Patient awake    Reviewed: Allergy & Precautions, H&P , NPO status , Patient's Chart, lab work & pertinent test results  Airway Mallampati: II  TM Distance: >3 FB Neck ROM: Full    Dental  (+) Dental Advisory Given, Teeth Intact   Pulmonary asthma , Current Smoker and Patient abstained from smoking.   Pulmonary exam normal breath sounds clear to auscultation       Cardiovascular Normal cardiovascular exam+ Valvular Problems/Murmurs  Rhythm:Regular Rate:Normal     Neuro/Psych  PSYCHIATRIC DISORDERS Anxiety Depression    negative neurological ROS     GI/Hepatic ,GERD  Medicated and Controlled,,(+)     substance abuse (last use 4 days ago)  marijuana use  Endo/Other  negative endocrine ROS    Renal/GU negative Renal ROS  negative genitourinary   Musculoskeletal  (+) Arthritis , Osteoarthritis,    Abdominal   Peds negative pediatric ROS (+)  Hematology negative hematology ROS (+)   Anesthesia Other Findings   Reproductive/Obstetrics negative OB ROS                             Anesthesia Physical Anesthesia Plan  ASA: 2  Anesthesia Plan: General   Post-op Pain Management: Dilaudid IV   Induction: Intravenous  PONV Risk Score and Plan: 4 or greater and Ondansetron, Dexamethasone, Midazolam and Scopolamine patch - Pre-op  Airway Management Planned: Oral ETT  Additional Equipment:   Intra-op Plan:   Post-operative Plan: Extubation in OR  Informed Consent: I have reviewed the patients History and Physical, chart, labs and discussed the procedure including the risks, benefits and alternatives for the proposed anesthesia with the patient or authorized representative who has indicated his/her understanding and acceptance.     Dental advisory given  Plan Discussed with: CRNA and Surgeon  Anesthesia Plan  Comments:        Anesthesia Quick Evaluation

## 2022-10-15 NOTE — Anesthesia Procedure Notes (Addendum)
Procedure Name: Intubation Date/Time: 10/15/2022 9:58 AM  Performed by: Ollen Bowl, CRNAPre-anesthesia Checklist: Patient identified, Patient being monitored, Timeout performed, Emergency Drugs available and Suction available Patient Re-evaluated:Patient Re-evaluated prior to induction Oxygen Delivery Method: Circle system utilized Preoxygenation: Pre-oxygenation with 100% oxygen Induction Type: IV induction Ventilation: Mask ventilation without difficulty Laryngoscope Size: Mac and 3 Grade View: Grade I Tube type: Oral Tube size: 7.0 mm Number of attempts: 1 Airway Equipment and Method: Stylet Placement Confirmation: ETT inserted through vocal cords under direct vision, positive ETCO2 and breath sounds checked- equal and bilateral Secured at: 22 cm Tube secured with: Tape Dental Injury: Teeth and Oropharynx as per pre-operative assessment

## 2022-10-16 LAB — SURGICAL PATHOLOGY

## 2022-10-20 NOTE — Addendum Note (Signed)
Addendum  created 10/20/22 1319 by Ollen Bowl, CRNA   Intraprocedure Staff edited

## 2022-11-19 ENCOUNTER — Ambulatory Visit (INDEPENDENT_AMBULATORY_CARE_PROVIDER_SITE_OTHER): Payer: BC Managed Care – PPO | Admitting: Surgery

## 2022-11-19 DIAGNOSIS — Z09 Encounter for follow-up examination after completed treatment for conditions other than malignant neoplasm: Secondary | ICD-10-CM

## 2022-11-19 NOTE — Progress Notes (Signed)
[facility]  I am calling the patient for post operative evaluation. This is not a billable encounter as it is under the [place] charges for the surgery.  The patient had a robotic assisted laparoscopic cholecystectomy on 1/24. The patient did not answer the phone.  I left a voicemail advising her to call the office if she is having any acute issues.  The office number is [phone].  Please call with any concerns.  Pathology: A. GALLBLADDER, CHOLECYSTECTOMY:  - Chronic cholecystitis   Will see the patient PRN.   Graciella Freer, DO [facility] [address] [place], [place] [address] [phone] (office)

## 2022-11-20 ENCOUNTER — Encounter: Payer: Self-pay | Admitting: Radiology

## 2023-05-05 IMAGING — CT CT HEAD W/O CM
4 series · 15 of 47 positions shown, 17 images · non-contrast
Comparison: None.

CLINICAL DATA: Headache, new or worsening, positional (Age 19-49y)

EXAM:
CT HEAD WITHOUT CONTRAST
TECHNIQUE: Contiguous axial images were obtained from the base of the skull
through the vertex without intravenous contrast.

[Series 3: head without · axial · non-contrast · 0.39mm/px · z∈[-49,+51]mm · 7 of 28 slices shown, 9 images]
[im 4/28  brain]
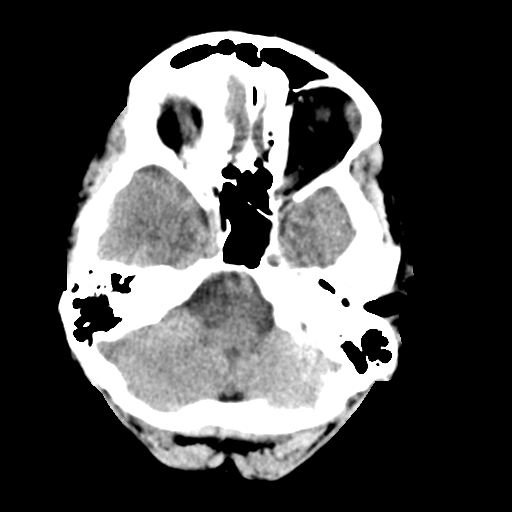
[im 4/28  bone]
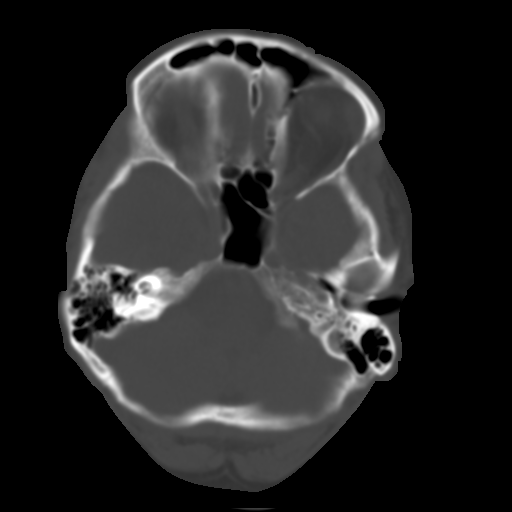
[im 7/28  brain]
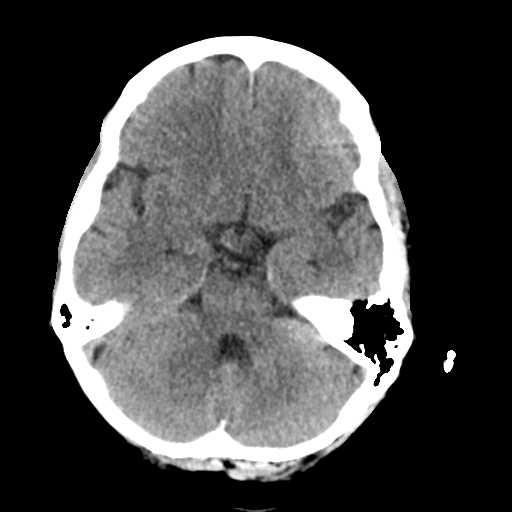
[im 11/28  brain]
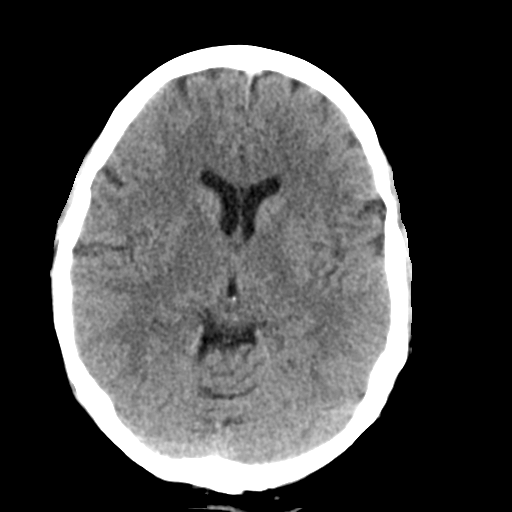
[im 14/28  brain]
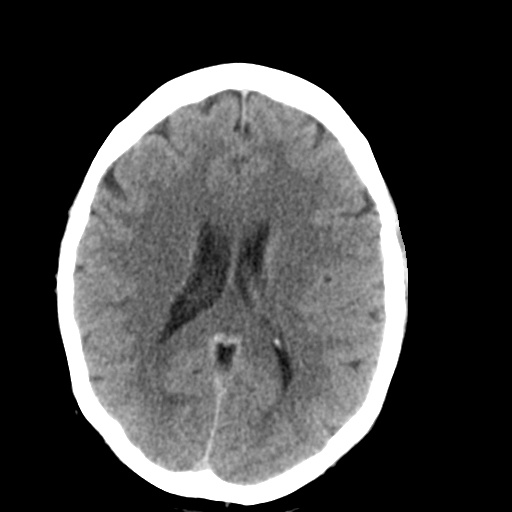
[im 17/28  brain]
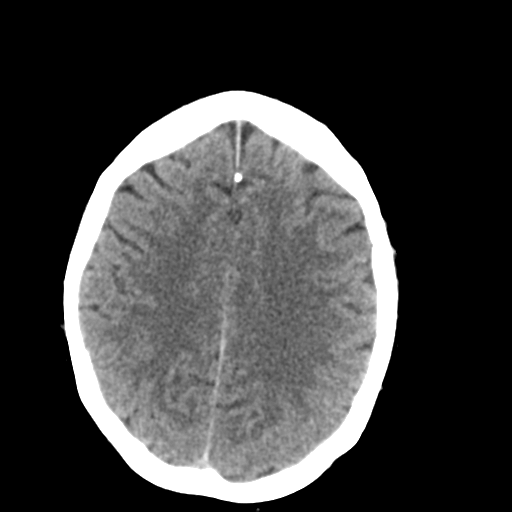
[im 17/28  bone]
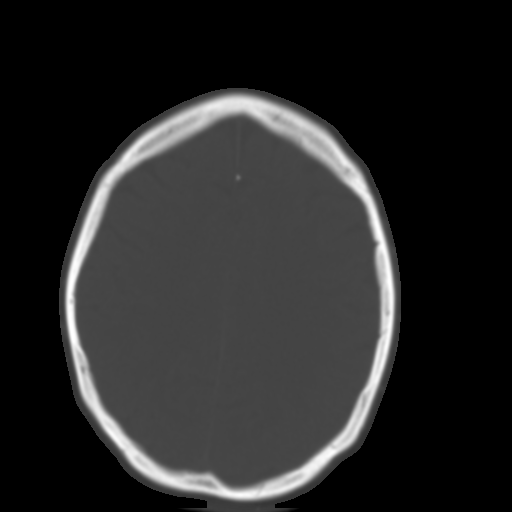
[im 21/28  brain]
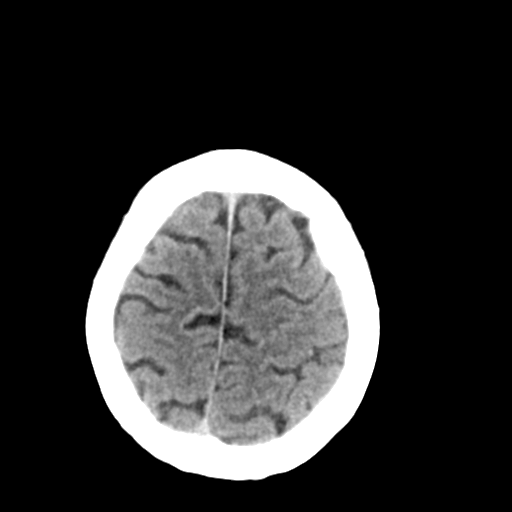
[im 24/28  brain]
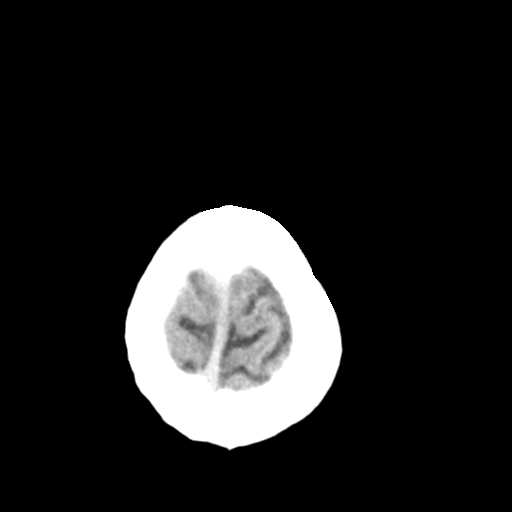

[Series 4: head bone · axial · 0.39mm/px · z∈[-52,-38]mm · 2 of 69 slices shown]
[im 7/69  bone]
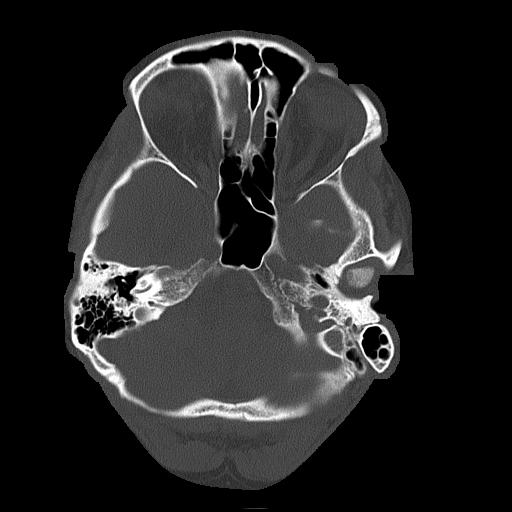
[im 14/69  bone]
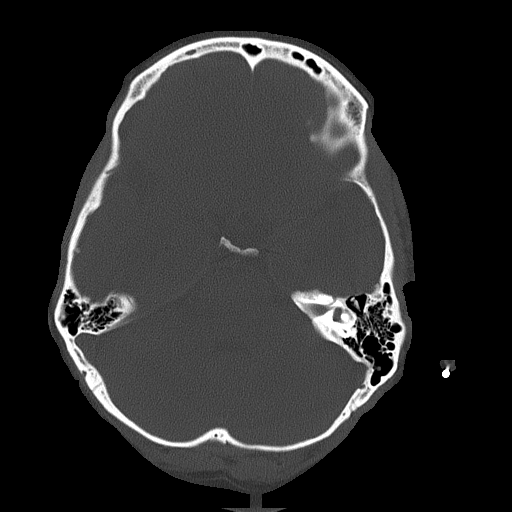

[Series 5: head without cor · coronal · non-contrast · 0.27mm/px · 3 of 63 slices shown]
[im 21/63  brain]
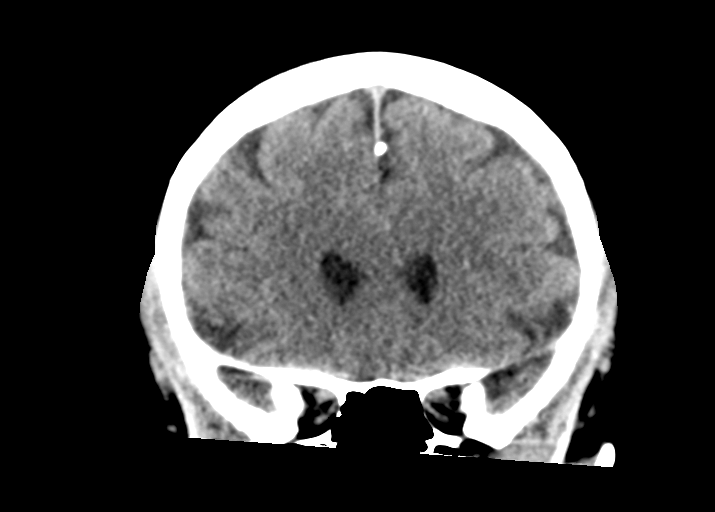
[im 28/63  brain]
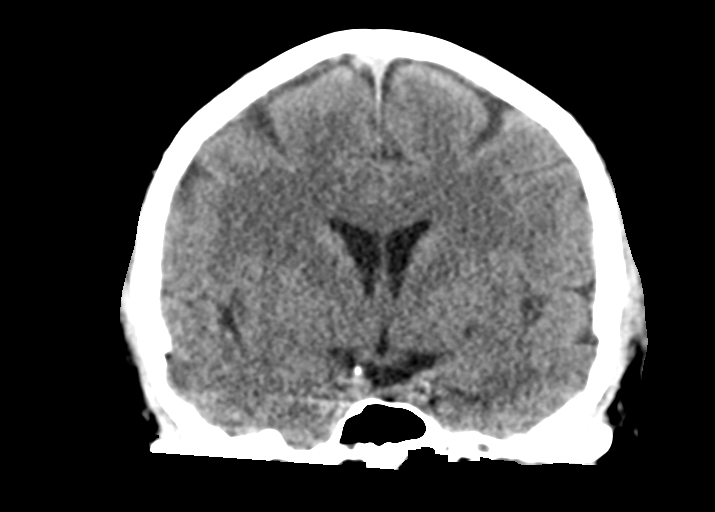
[im 35/63  brain]
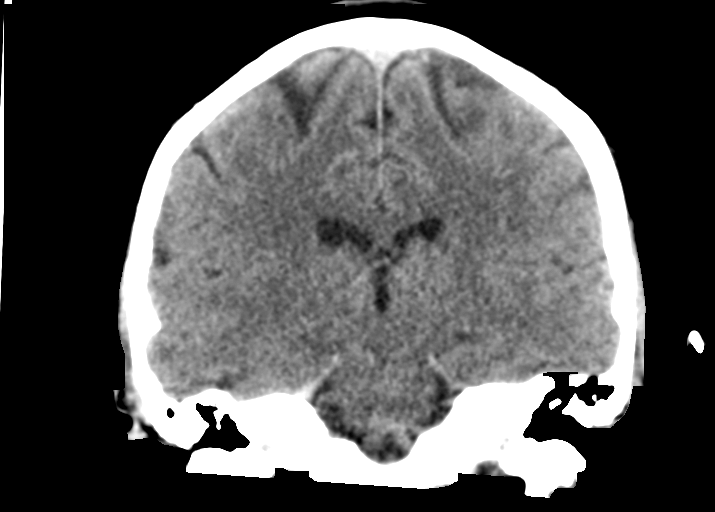

[Series 6: head without sag · sagittal · non-contrast · 0.28mm/px · 3 of 50 slices shown]
[im 17/50  brain]
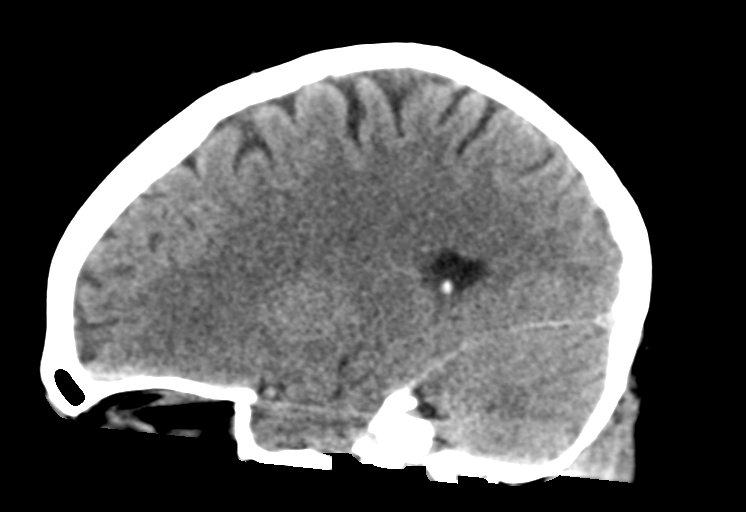
[im 25/50  brain]
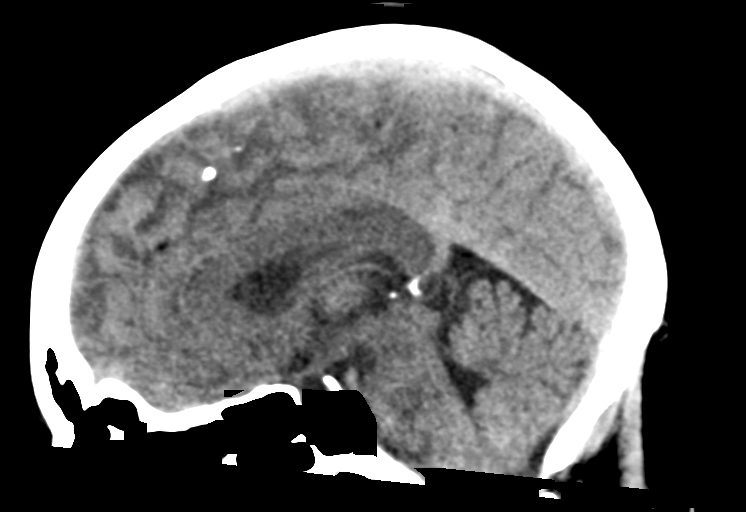
[im 33/50  brain]
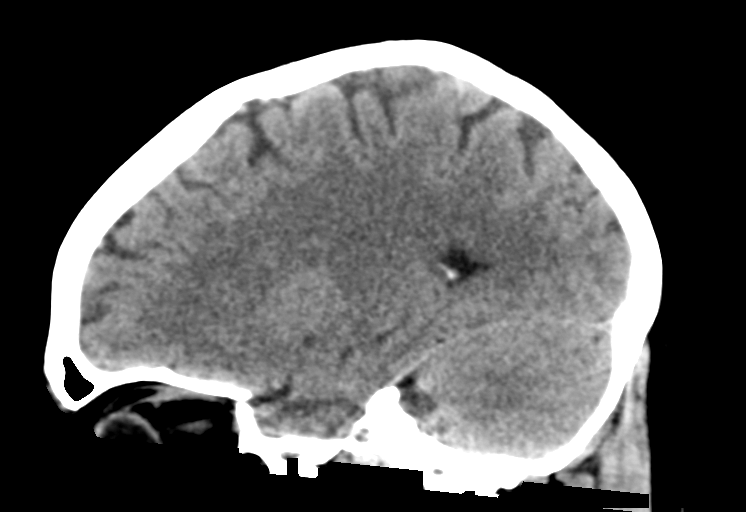

[15 of 47 positions shown; findings below may reference images not displayed]

FINDINGS: Brain: No evidence of acute intracranial hemorrhage or extra-axial
collection.No evidence of mass lesion/concern mass effect.The
ventricles are normal in size.

Vascular: No hyperdense vessel or unexpected calcification.

Skull: Normal. Negative for fracture or focal lesion.

Sinuses/Orbits: No acute finding.

Other: None.
IMPRESSION: No acute intracranial abnormality.

## 2023-05-21 ENCOUNTER — Emergency Department (HOSPITAL_COMMUNITY)
Admission: EM | Admit: 2023-05-21 | Discharge: 2023-05-22 | Disposition: A | Discharge status: Home / Self Care | Payer: BC Managed Care – PPO | Attending: Emergency Medicine | Admitting: Emergency Medicine

## 2023-05-21 ENCOUNTER — Other Ambulatory Visit: Payer: Self-pay

## 2023-05-21 ENCOUNTER — Encounter (HOSPITAL_COMMUNITY): Payer: Self-pay

## 2023-05-21 DIAGNOSIS — M545 Low back pain, unspecified: Secondary | ICD-10-CM

## 2023-05-21 LAB — CBC WITH DIFFERENTIAL/PLATELET
Abs Immature Granulocytes: 0.03 10*3/uL (ref 0.00–0.07)
Basophils Absolute: 0.1 10*3/uL (ref 0.0–0.1)
Basophils Relative: 1 %
Eosinophils Absolute: 0.3 10*3/uL (ref 0.0–0.5)
Eosinophils Relative: 3 %
HCT: 45.5 % (ref 36.0–46.0)
Hemoglobin: 15.4 g/dL — ABNORMAL HIGH (ref 12.0–15.0)
Immature Granulocytes: 0 %
Lymphocytes Relative: 26 %
Lymphs Abs: 2.3 10*3/uL (ref 0.7–4.0)
MCH: 33.8 pg (ref 26.0–34.0)
MCHC: 33.8 g/dL (ref 30.0–36.0)
MCV: 99.8 fL (ref 80.0–100.0)
Monocytes Absolute: 0.4 10*3/uL (ref 0.1–1.0)
Monocytes Relative: 5 %
Neutro Abs: 5.7 10*3/uL (ref 1.7–7.7)
Neutrophils Relative %: 65 %
Platelets: 143 10*3/uL — ABNORMAL LOW (ref 150–400)
RBC: 4.56 MIL/uL (ref 3.87–5.11)
RDW: 12.2 % (ref 11.5–15.5)
WBC: 8.7 10*3/uL (ref 4.0–10.5)
nRBC: 0 % (ref 0.0–0.2)

## 2023-05-21 LAB — BASIC METABOLIC PANEL
Anion gap: 9 (ref 5–15)
BUN: 10 mg/dL (ref 6–20)
CO2: 22 mmol/L (ref 22–32)
Calcium: 8.7 mg/dL — ABNORMAL LOW (ref 8.9–10.3)
Chloride: 106 mmol/L (ref 98–111)
Creatinine, Ser: 0.58 mg/dL (ref 0.44–1.00)
GFR, Estimated: >60 mL/min (ref >60)
Glucose, Bld: 91 mg/dL (ref 70–99)
Potassium: 3.8 mmol/L (ref 3.5–5.1)
Sodium: 137 mmol/L (ref 135–145)

## 2023-05-21 NOTE — ED Triage Notes (Signed)
Lower back pain x 2-3 weeks. Says she has history of DDD, scoliosis and ongoing back pain.   For the last couple weeks pain has been intolerable and 1 episode of loss of bladder control.

## 2023-05-21 NOTE — ED Provider Triage Note (Signed)
Emergency Medicine Provider Triage Evaluation Note  Adithri Cashell , a 37 y.o. female  was evaluated in triage.  Pt complains of significant low back pain.  Has history of DJD, as well as mild scoliosis.  States since this morning she has not been able to move without significant pain.  Without acute distress during interview.  Review of Systems  Positive: As above Negative: As above  Physical Exam  There were no vitals taken for this visit. Gen:   Awake, no distress   Resp:  Normal effort  MSK:   Moves extremities without difficulty  Other:    Medical Decision Making  Medically screening exam initiated at 11:08 PM.  Appropriate orders placed.  Leydi Kocian was informed that the remainder of the evaluation will be completed by another provider, this initial triage assessment does not replace that evaluation, and the importance of remaining in the ED until their evaluation is complete.    Marita Kansas, PA-C 05/21/23 2308

## 2023-05-22 ENCOUNTER — Emergency Department (HOSPITAL_COMMUNITY): Payer: BC Managed Care – PPO

## 2023-05-22 LAB — URINALYSIS, ROUTINE W REFLEX MICROSCOPIC
Bilirubin Urine: NEGATIVE
Budding Yeast: NONE SEEN
Crystals: NONE SEEN — AB
Glucose, UA: NEGATIVE mg/dL
Hgb urine dipstick: NEGATIVE
Ketones, ur: 5 mg/dL — AB
Nitrite: NEGATIVE
Protein, ur: NEGATIVE mg/dL
Specific Gravity, Urine: 1.024 (ref 1.005–1.030)
pH: 5 (ref 5.0–8.0)

## 2023-05-22 LAB — HCG, SERUM, QUALITATIVE: Preg, Serum: NEGATIVE

## 2023-05-22 MED ORDER — DIAZEPAM 2 MG PO TABS
2.0000 mg | ORAL_TABLET | Freq: Once | ORAL | Status: AC
  Administered 2023-05-22: 2 mg via ORAL
  Filled 2023-05-22: qty 1

## 2023-05-22 MED ORDER — METHOCARBAMOL 500 MG PO TABS: 500.0000 mg | ORAL_TABLET | Freq: Two times a day (BID) | ORAL | 0 refills | Status: AC

## 2023-05-22 MED ORDER — PREDNISONE 20 MG PO TABS: 40.0000 mg | ORAL_TABLET | Freq: Every day | ORAL | 0 refills | Status: AC

## 2023-05-22 NOTE — Discharge Instructions (Addendum)
You have been seen here for back pain. I recommend taking over-the-counter pain medications like ibuprofen and/or Tylenol every 6 as needed.  Please follow dosage and on the back of bottle.  I also recommend applying heat to the area and stretching out the muscles as this will help decrease stiffness and pain.  I have given you information on exercises please follow.  I have also given you a prescription for a muscle relaxer this can make you drowsy do not consume alcohol or operate heavy machinery when taking this medication.   Please follow-up with neurosurgery for further evaluation  Come back to the emergency department if you develop chest pain, shortness of breath, severe abdominal pain, uncontrolled nausea, vomiting, diarrhea.

## 2023-05-22 NOTE — ED Provider Notes (Signed)
[place] EMERGENCY DEPARTMENT AT [facility] Provider Note   CSN: [id number] Arrival date & time: 05/21/23  2209     History  Chief Complaint  Patient presents with   Back Pain    Lauren Castaneda is a 37 y.o. female.  HPI   Patient with medical history including chronic back pain, GERD, presenting with lower back pain.  Patient states that this started about 2 weeks ago, after helping her husband move furniture, states its in her  lower back, will occasionally radiate into her hips, pain is worsened with movement, improved with rest, she endorses yesterday when she stood up she urinated on herself.  She states is unclear if this was from pain, she states that she is not lost control of her bladder since then.  She denies any current saddle paresthesias, paresthesia or recent feeling down her leg.  States that she has been taking pain medication without relief.  No history of aneurysms dissections or connective tissue disorders, she denies dysuria hematuria urinary frequency or urgency.  Home Medications Prior to Admission medications   Medication Sig Start Date End Date Taking? Authorizing Provider  methocarbamol (ROBAXIN) 500 MG tablet Take 1 tablet (500 mg total) by mouth 2 (two) times daily. 05/22/23  Yes Carroll Sage, PA-C  predniSONE (DELTASONE) 20 MG tablet Take 2 tablets (40 mg total) by mouth daily for 5 days. 05/22/23 05/27/23 Yes Carroll Sage, PA-C  albuterol (VENTOLIN HFA) 108 (90 Base) MCG/ACT inhaler Inhale 1-2 puffs into the lungs every 6 (six) hours as needed for wheezing or shortness of breath.    [provider]  busPIRone (BUSPAR) 10 MG tablet Take 10 mg by mouth 2 (two) times daily. 09/16/22   [provider]  lansoprazole (PREVACID) 30 MG capsule Take 30 mg by mouth daily as needed (heartburn). 08/25/22   [provider]  ondansetron (ZOFRAN ODT) 4 MG disintegrating tablet Take 1 tablet (4 mg total) by mouth every 8  (eight) hours as needed for nausea. 08/24/19   Eber Hong, MD  oxyCODONE (ROXICODONE) 5 MG immediate release tablet Take 1 tablet (5 mg total) by mouth every 6 (six) hours as needed. 10/15/22   Pappayliou, Santina Evans A, DO  rizatriptan (MAXALT) 10 MG tablet Take 10 mg by mouth as needed for migraine. 08/26/22   [provider]  senna (SENOKOT) 8.6 MG TABS tablet Take 1 tablet by mouth in the morning and at bedtime. 09/16/22   [provider]      Allergies    Phenergan [promethazine hcl]    Review of Systems   Review of Systems  Constitutional:  Negative for chills and fever.  Respiratory:  Negative for shortness of breath.   Cardiovascular:  Negative for chest pain.  Gastrointestinal:  Negative for abdominal pain.  Musculoskeletal:  Positive for back pain.  Neurological:  Negative for headaches.    Physical Exam Updated Vital Signs BP 102/65   Pulse 65   Temp 98.1 F (36.7 C) (Oral)   Resp 14   Ht 5\' 4"  (1.626 m)   Wt 74.8 kg   SpO2 100%   BMI 28.32 kg/m  Physical Exam Vitals and nursing note reviewed.  Constitutional:      General: She is not in acute distress.    Appearance: She is not ill-appearing.  HENT:     Head: Normocephalic and atraumatic.     Nose: No congestion.  Eyes:     Conjunctiva/sclera: Conjunctivae normal.  Cardiovascular:  Rate and Rhythm: Normal rate and regular rhythm.     Pulses: Normal pulses.     Heart sounds: No murmur heard.    No friction rub. No gallop.  Pulmonary:     Effort: No respiratory distress.     Breath sounds: No wheezing, rhonchi or rales.  Abdominal:     Palpations: Abdomen is soft.     Tenderness: There is no abdominal tenderness. There is no right CVA tenderness or left CVA tenderness.  Musculoskeletal:     Comments: Spine was palpated slight tenderness to palpation along the lumbar region without crepitus or deformities noted.  Patient has 5 out of 5 strength, neurovascular tact in lower extremities,  2+ dorsal pedal pulses, sensation tact light touch, 2+ patellar reflexes.  Skin:    General: Skin is warm and dry.  Neurological:     Mental Status: She is alert.  Psychiatric:        Mood and Affect: Mood normal.     ED Results / Procedures / Treatments   Labs (all labs ordered are listed, but only abnormal results are displayed) Labs Reviewed  CBC WITH DIFFERENTIAL/PLATELET - Abnormal; Notable for the following components:      Result Value   Hemoglobin 15.4 (*)    Platelets 143 (*)    All other components within normal limits  BASIC METABOLIC PANEL - Abnormal; Notable for the following components:   Calcium 8.7 (*)    All other components within normal limits  URINALYSIS, ROUTINE W REFLEX MICROSCOPIC - Abnormal; Notable for the following components:   APPearance CLOUDY (*)    Ketones, ur 5 (*)    Leukocytes,Ua LARGE (*)    Bacteria, UA MANY (*)    Crystals NONE SEEN (*)    All other components within normal limits  HCG, SERUM, QUALITATIVE    EKG None  Radiology CT Lumbar Spine Wo Contrast  Result Date: 05/22/2023 CLINICAL DATA:  Trauma and low back pain. EXAM: CT LUMBAR SPINE WITHOUT CONTRAST TECHNIQUE: Multidetector CT imaging of the lumbar spine was performed without intravenous contrast administration. Multiplanar CT image reconstructions were also generated. RADIATION DOSE REDUCTION: This exam was performed according to the departmental dose-optimization program which includes automated exposure control, adjustment of the mA and/or kV according to patient size and/or use of iterative reconstruction technique. COMPARISON:  Lumbar spine MRI dated 11/15/2018. FINDINGS: Segmentation: 5 lumbar type vertebrae. Alignment: Normal. Vertebrae: No acute fracture or focal pathologic process. Paraspinal and other soft tissues: Negative. Disc levels: Degenerative changes with disc space narrowing and endplate irregularity at L4-L5. There is posterior disc bulge at L4-L5, better  evaluated on the prior MRI. IMPRESSION: 1. No acute/traumatic lumbar spine pathology. 2. Degenerative changes at L4-L5. Electronically Signed   By: Elgie Collard M.D.   On: 05/22/2023 01:05    Procedures Procedures    Medications Ordered in ED Medications  diazepam (VALIUM) tablet 2 mg (2 mg Oral Given 05/22/23 0547)    ED Course/ Medical Decision Making/ A&P                                 Medical Decision Making Amount and/or Complexity of Data Reviewed Labs: ordered.  Risk Prescription drug management.   This patient presents to the ED for concern of back pain, this involves an extensive number of treatment options, and is a complaint that carries with it a high risk of complications and morbidity.  The differential diagnosis includes AAA, dissection, UTI, Pilo, kidney stone, cauda equina    Additional history obtained:  Additional history obtained from mother at bedside External records from outside source obtained and reviewed including recent ER notes   Co morbidities that complicate the patient evaluation  Chronic back pain  Social Determinants of Health:  N/a    Lab Tests:  I Ordered, and personally interpreted labs.  The pertinent results include: CBC unremarkable, BMP unremarkable, hCG negative   Imaging Studies ordered:  I ordered imaging studies including CT lumbar spine I independently visualized and interpreted imaging which showed negative acute findings I agree with the radiologist interpretation   Cardiac Monitoring:  The patient was maintained on a cardiac monitor.  I personally viewed and interpreted the cardiac monitored which showed an underlying rhythm of: N/A   Medicines ordered and prescription drug management:  I ordered medication including Valium I have reviewed the patients home medicines and have made adjustments as needed  Critical Interventions:  N/A   Reevaluation:  Presents with back pain, triage obtain lab  workup imaging which I personally viewed unremarkable, will add on a UA, obtain postvoid she endorses urinary incontinency.  Patient was reassessed, resting comfortably, states her pain has improved, she is not endorsing any urinary symptoms, UA looks contaminated there are squamous cells present, I doubt UTI at this time, patient is in agreement discharge.  Consultations Obtained:  N/A    Test Considered:  N/A    Rule out Suspicion for AAA or dissection at this time presentation atypical, she has low risk factors, pain is focalized on reproducible.  I doubt spinal fracture CT imaging negative these findings.  Suspicion for epidural abscess is low at this time no evidence infection my exam, and pain is focalized reproducible.  Low suspicion for cauda equina/spinal cord stenosis is low at this time as she is neurovascular intact, patient's postvoid is less than millimeters in the bladder.  Suspicion for kidney stone is low at this time she has no flank tenderness, no urinary symptoms, presentation atypical.     Dispostion and problem list  After consideration of the diagnostic results and the patients response to treatment, I feel that the patent would benefit from back pain.  Back pain-likely acute on chronic, will start on a short course of steroids, muscle relaxers, have him follow-up with neurosurgery for further evaluation.            Final Clinical Impression(s) / ED Diagnoses Final diagnoses:  Acute midline low back pain without sciatica    Rx / DC Orders ED Discharge Orders          Ordered    predniSONE (DELTASONE) 20 MG tablet  Daily        05/22/23 0649    methocarbamol (ROBAXIN) 500 MG tablet  2 times daily        05/22/23 0649              Carroll Sage, PA-C 05/22/23 2595    Nira Conn, MD 05/22/23 0730

## 2023-09-27 IMAGING — DX DG CHEST 1V PORT
1 series · 1 of 1 positions shown · non-contrast
Comparison: 08/21/2021

CLINICAL DATA: Chest pain.

EXAM:
PORTABLE CHEST 1 VIEW

[chest ap]
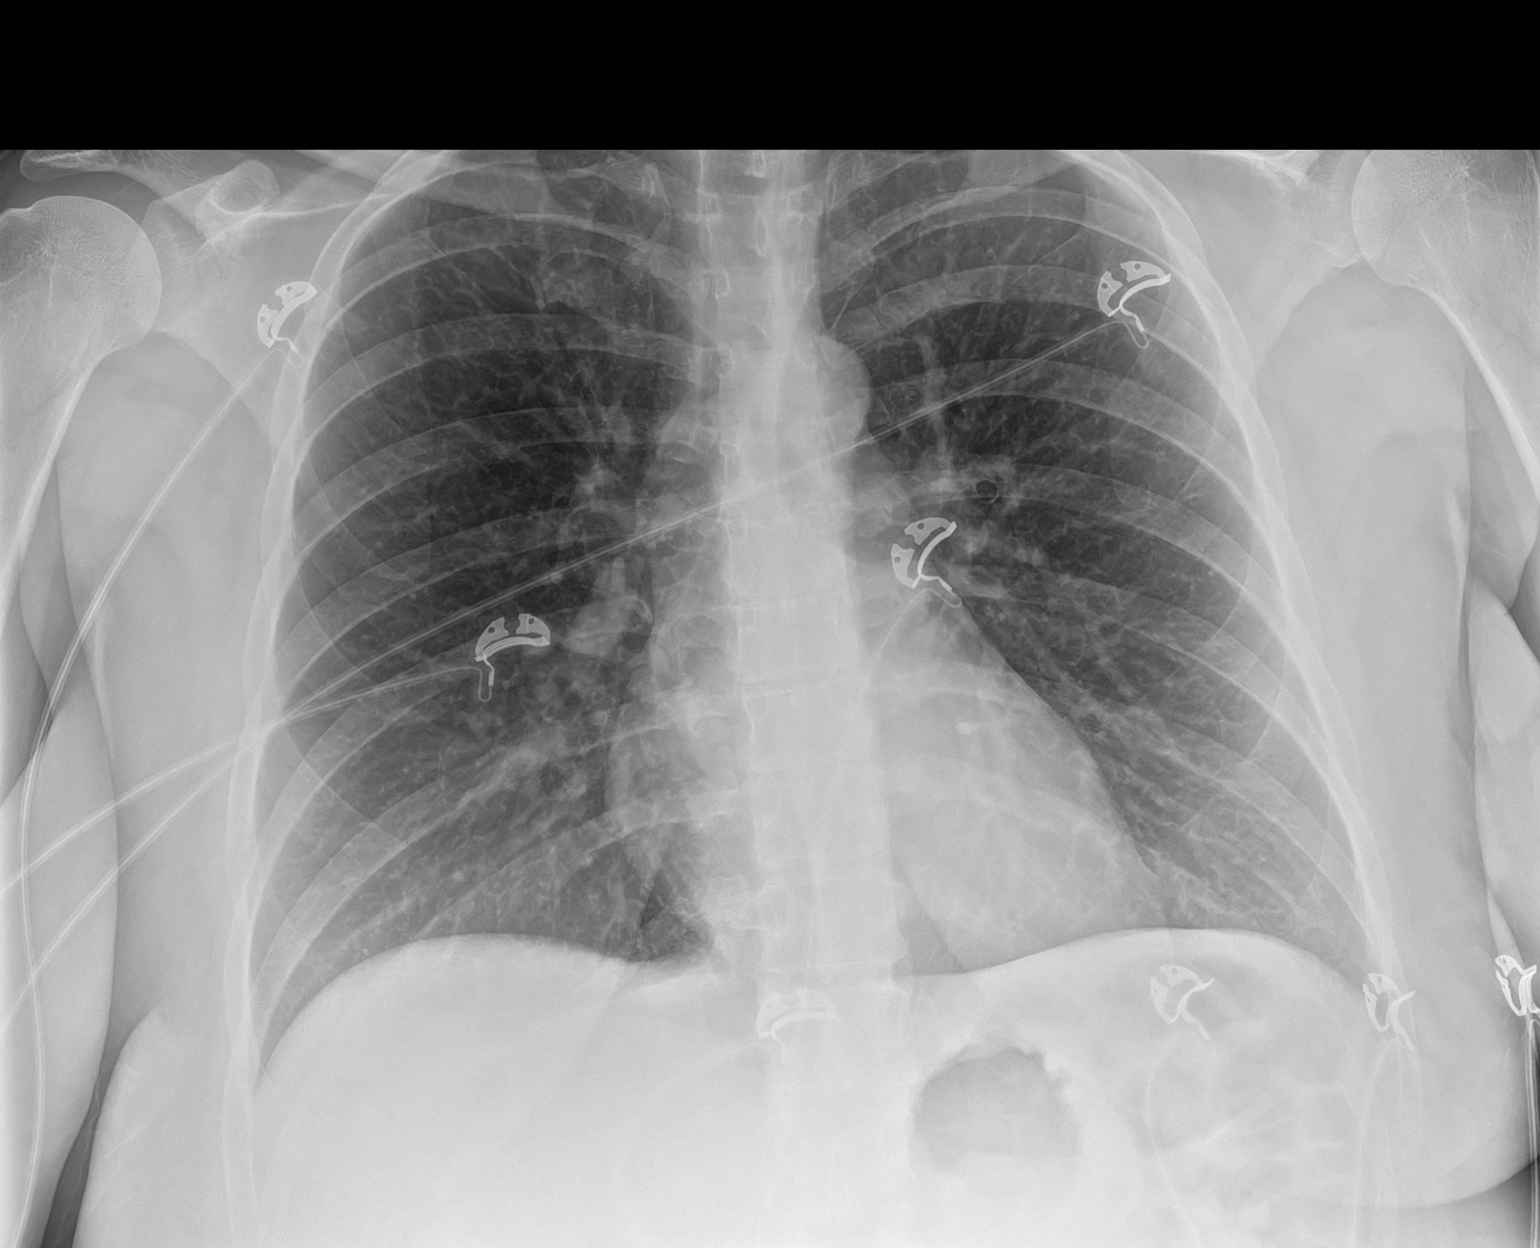

[1 of 1 positions shown; findings below may reference images not displayed]

FINDINGS: Normal heart size and mediastinal contours. No acute infiltrate or
edema. No effusion or pneumothorax. No acute osseous findings.
IMPRESSION: Negative chest.

## 2024-05-13 ENCOUNTER — Encounter: Payer: Self-pay | Admitting: Radiology

## 2024-07-25 ENCOUNTER — Encounter: Payer: Self-pay | Admitting: Radiology
